# Patient Record
Sex: Female | Born: 1970 | Race: White | Hispanic: No | Smoking: Never smoker
Health system: Southern US, Community
[De-identification: ages and names within clinical notes are randomized; demographics above are authoritative.]

## PROBLEM LIST (undated history)

## (undated) DIAGNOSIS — F429 Obsessive-compulsive disorder, unspecified: Secondary | ICD-10-CM

## (undated) DIAGNOSIS — F419 Anxiety disorder, unspecified: Secondary | ICD-10-CM

## (undated) DIAGNOSIS — C189 Malignant neoplasm of colon, unspecified: Secondary | ICD-10-CM

## (undated) DIAGNOSIS — T7840XA Allergy, unspecified, initial encounter: Secondary | ICD-10-CM

## (undated) DIAGNOSIS — R51 Headache: Secondary | ICD-10-CM

## (undated) DIAGNOSIS — Z87442 Personal history of urinary calculi: Secondary | ICD-10-CM

## (undated) DIAGNOSIS — F99 Mental disorder, not otherwise specified: Secondary | ICD-10-CM

## (undated) DIAGNOSIS — N649 Disorder of breast, unspecified: Secondary | ICD-10-CM

## (undated) HISTORY — DX: Allergy, unspecified, initial encounter: T78.40XA

## (undated) HISTORY — DX: Headache: R51

## (undated) HISTORY — PX: BREAST EXCISIONAL BIOPSY: SUR124

## (undated) HISTORY — DX: Anxiety disorder, unspecified: F41.9

## (undated) HISTORY — DX: Obsessive-compulsive disorder, unspecified: F42.9

## (undated) HISTORY — PX: ENDOMETRIAL ABLATION: SHX621

## (undated) HISTORY — DX: Malignant neoplasm of colon, unspecified: C18.9

## (undated) HISTORY — DX: Disorder of breast, unspecified: N64.9

## (undated) HISTORY — DX: Mental disorder, not otherwise specified: F99

---
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
## 1997-05-31 HISTORY — PX: OTHER SURGICAL HISTORY: SHX169

## 2011-01-11 ENCOUNTER — Encounter: Payer: Self-pay | Admitting: Family Medicine

## 2011-01-11 ENCOUNTER — Encounter: Payer: Self-pay | Admitting: *Deleted

## 2011-01-11 ENCOUNTER — Ambulatory Visit (INDEPENDENT_AMBULATORY_CARE_PROVIDER_SITE_OTHER): Payer: Self-pay | Admitting: Family Medicine

## 2011-01-11 VITALS — BP 98/68 | HR 91 | Temp 97.9°F | Resp 18 | Ht 65.0 in | Wt 151.0 lb

## 2011-01-11 DIAGNOSIS — M9902 Segmental and somatic dysfunction of thoracic region: Secondary | ICD-10-CM

## 2011-01-11 DIAGNOSIS — M999 Biomechanical lesion, unspecified: Secondary | ICD-10-CM

## 2011-01-11 MED ORDER — CYCLOBENZAPRINE HCL 10 MG PO TABS: 10.0000 mg | ORAL_TABLET | Freq: Three times a day (TID) | ORAL | Status: AC | PRN

## 2011-01-11 NOTE — Progress Notes (Signed)
  Subjective:    Patient ID: Shirley Murray, female    DOB: February 14, 1971, 40 y.o.   MRN: [id number]  HPI Pt presents with pain in her Left axilla. She states her pain started Friday night while watching TV. She denies any activities or injuries that could have caused the pain. She has never had this pain before. The pain was severe enough that she almost went to the ER. Saturday, the pain was a little better, but today at work, the pain was significant and caused some discomfort with deep breathing.   Review of Systems Neg except as in HPI    Objective:   Physical Exam  Vitals reviewed.  Vitals reviewed. Point tenderness of the midaxilliary region of 4th Rib. No cervical or first rib dysfunction palpated. No Caliper motion of the rib with inspiration. No tenderness at rib heads anteriorly or posteriorly. Minimal assymmetry noted along the articulation of the rib head with the spinous processes.       Assessment & Plan:  1. Somatic Dysfunction of Left 4th Rib: OMT modalities used: SCS, muscle energy, myofascial release, Still's technique. Pt decline LVLA or HVLA. Pain relief initially, but pain returned. Repeat manipulation unsuccessful. Will try NSAID (OTC motrin) and Flexeril. WIll reeval in AM or prn to determine if additional manipulation is necessary.

## 2011-01-26 ENCOUNTER — Encounter: Payer: Self-pay | Admitting: Nurse Practitioner

## 2011-01-26 ENCOUNTER — Ambulatory Visit (INDEPENDENT_AMBULATORY_CARE_PROVIDER_SITE_OTHER): Payer: Commercial Managed Care - PPO | Admitting: Nurse Practitioner

## 2011-01-26 DIAGNOSIS — G4700 Insomnia, unspecified: Secondary | ICD-10-CM

## 2011-01-26 DIAGNOSIS — G43009 Migraine without aura, not intractable, without status migrainosus: Secondary | ICD-10-CM | POA: Insufficient documentation

## 2011-01-26 DIAGNOSIS — F429 Obsessive-compulsive disorder, unspecified: Secondary | ICD-10-CM

## 2011-01-26 MED ORDER — VENLAFAXINE HCL ER 150 MG PO CP24: 150.0000 mg | ORAL_CAPSULE | Freq: Every day | ORAL | Status: DC

## 2011-01-26 MED ORDER — ESZOPICLONE 2 MG PO TABS: 2.0000 mg | ORAL_TABLET | Freq: Every day | ORAL | Status: DC

## 2011-01-26 MED ORDER — TOPIRAMATE 200 MG PO TABS: 200.0000 mg | ORAL_TABLET | Freq: Every day | ORAL | Status: DC

## 2011-01-26 MED ORDER — TRAMADOL HCL 50 MG PO TABS: 50.0000 mg | ORAL_TABLET | Freq: Four times a day (QID) | ORAL | Status: AC | PRN

## 2011-01-26 MED ORDER — VENLAFAXINE HCL ER 75 MG PO CP24: 75.0000 mg | ORAL_CAPSULE | Freq: Every day | ORAL | Status: DC

## 2011-01-26 NOTE — Progress Notes (Signed)
  Subjective:    Patient ID: Shirley Murray, female    DOB: 06/02/1970, 40 y.o.   MRN: [id number]  HPI C/O migraine without aura. She has had migraine since she was 40 years old. Strong family history. Headaches are bilat temples and occipital. Severe in one month 3-5, moderates 13, milds 5.....almost daily headache of some sort. Headaches have a rapid onset.Now taking Imitrex and Topamax 150mg  for prevention. In past has taken toradol, zomig and maxalt.. Sleep is an issue for her. Has difficulty falling asleeep, taking over one hour to fall asleep and restless throughout the night and wakes approximately 2 times. Had some unusual rxn to [place] including calling people on phone. Has OCD and takes 40mg  paxil. Has side effects such as dry mouth and constipation. Currently lives with husband and children. No unusual stressors at work/ home. Is a nurse at [employer].      Review of Systems  Constitutional: Negative.   Eyes: Negative.   Respiratory: Negative.   Cardiovascular: Negative.   Gastrointestinal: Negative.   Genitourinary: Negative.   Musculoskeletal: Negative.   Neurological: Positive for headaches.  Psychiatric/Behavioral:       OCD       Objective:   Physical Exam  Constitutional: She is oriented to person, place, and time. She appears well-developed and well-nourished.  HENT:  Head: Normocephalic and atraumatic.  Eyes: Pupils are equal, round, and reactive to light.  Neck: Normal range of motion.  Cardiovascular: Normal rate and regular rhythm.   Pulmonary/Chest: Effort normal.  Musculoskeletal: Normal range of motion.  Neurological: She is alert and oriented to person, place, and time.  Skin: Skin is warm and dry.  Psychiatric: She has a normal mood and affect. Her behavior is normal. Judgment and thought content normal.          Assessment & Plan:  A: Migraine without aura. After lengthy discussion we have decided to taper her off Paxil. This should help  weight gain, constipation and dry mouth. She will take 20mg  for 2 weeks then off. In its place she will take effexor 75mg  for 2 weeks then up to 150mg  daily in am. If her OCD gets worse we will adjust this plan. We will also increase Topamax to 200mg  QHS. She will trial Lunesta 2 mg for sleep. She will continue to use imitrex, phenergan and add vicoden for rescue. We will see her back in 6 weeks or prn.

## 2011-02-16 ENCOUNTER — Encounter: Payer: Self-pay | Admitting: Nurse Practitioner

## 2011-02-16 ENCOUNTER — Ambulatory Visit (INDEPENDENT_AMBULATORY_CARE_PROVIDER_SITE_OTHER): Payer: Commercial Managed Care - PPO | Admitting: Nurse Practitioner

## 2011-02-16 DIAGNOSIS — G43909 Migraine, unspecified, not intractable, without status migrainosus: Secondary | ICD-10-CM

## 2011-02-16 DIAGNOSIS — F429 Obsessive-compulsive disorder, unspecified: Secondary | ICD-10-CM

## 2011-02-16 DIAGNOSIS — G4700 Insomnia, unspecified: Secondary | ICD-10-CM

## 2011-02-16 MED ORDER — CITALOPRAM HYDROBROMIDE 40 MG PO TABS: 40.0000 mg | ORAL_TABLET | Freq: Every day | ORAL | Status: DC

## 2011-02-16 MED ORDER — ZALEPLON 10 MG PO CAPS: 10.0000 mg | ORAL_CAPSULE | Freq: Every day | ORAL | Status: DC

## 2011-02-16 MED ORDER — ALPRAZOLAM 0.5 MG PO TABS: 0.5000 mg | ORAL_TABLET | Freq: Three times a day (TID) | ORAL | Status: AC | PRN

## 2011-02-16 NOTE — Progress Notes (Signed)
Last visit we had agreed upon a taper of effexor and paxil. She did not do well with effexor and ended up tapering off both. Has been off both for about one week. She felt she had an emotional rxn to effexor and did not want to take paxil secondary to constipation. Did very well with lunesta but cost was prohibitive. Difficult to know how headaches are at this time.  PE: negative exam  A: migraine  Insomnia OCD  P: We agreed to start Celexa for OCD. She will use xanax until then to keep her calmer. We will trial Sonata for sleep as it is generic. We discussed that it is impossible to know her headache status until her OCD and insomnia are corrected. She will RTC in 4-6 weeks and we will reeval then.

## 2011-02-26 ENCOUNTER — Other Ambulatory Visit (HOSPITAL_COMMUNITY): Payer: Self-pay | Admitting: Obstetrics

## 2011-02-26 DIAGNOSIS — Z1231 Encounter for screening mammogram for malignant neoplasm of breast: Secondary | ICD-10-CM

## 2011-03-09 ENCOUNTER — Ambulatory Visit (HOSPITAL_COMMUNITY)
Admission: RE | Admit: 2011-03-09 | Discharge: 2011-03-09 | Disposition: A | Discharge status: Home / Self Care | Payer: Commercial Managed Care - PPO | Source: Ambulatory Visit | Attending: Obstetrics | Admitting: Obstetrics

## 2011-03-09 DIAGNOSIS — Z1231 Encounter for screening mammogram for malignant neoplasm of breast: Secondary | ICD-10-CM | POA: Insufficient documentation

## 2011-03-24 ENCOUNTER — Ambulatory Visit (INDEPENDENT_AMBULATORY_CARE_PROVIDER_SITE_OTHER): Payer: Commercial Managed Care - PPO | Admitting: Family Medicine

## 2011-03-24 DIAGNOSIS — R109 Unspecified abdominal pain: Secondary | ICD-10-CM

## 2011-03-24 LAB — CBC WITH DIFFERENTIAL/PLATELET
HCT: 39.3 % (ref 36.0–46.0)
MCHC: 35.4 g/dL (ref 30.0–36.0)
Monocytes Absolute: 0.5 10*3/uL (ref 0.1–1.0)
Neutro Abs: 5.2 10*3/uL (ref 1.7–7.7)
Platelets: 212 10*3/uL (ref 150–400)
RDW: 12.8 % (ref 11.5–15.5)

## 2011-03-24 LAB — POCT PREGNANCY, URINE: Preg Test, Ur: NEGATIVE

## 2011-03-24 LAB — POCT URINALYSIS DIP (DEVICE): pH: 6 (ref 5.0–8.0)

## 2011-03-24 NOTE — Progress Notes (Signed)
Subjective:    Patient ID: Shirley Murray, female    DOB: Mar 03, 1971, 40 y.o.   MRN: [id number]  HPI Patient states she started to have sharp pain around her umbilicus last night around 7 PM. She states she has had no appetite but has eaten small portions of food since prior to the onset of the pain. She denies any nausea or vomiting. She reports last BM 2 days ago, but that she only goes 1-2 times a week. She does not feel like she needs to have a stool. She reports that the pain is sharp and makes it uncomfortable to sit, stand, or extend her legs. She states she feels very ill. Denies fever, chills, or sick contacts.   Review of Systems     Objective:   Physical Exam  Constitutional: She is oriented to person, place, and time. She appears well-developed and well-nourished. She appears distressed.  HENT:  Head: Normocephalic.  Neck: Normal range of motion.  Cardiovascular: Normal rate, regular rhythm and normal heart sounds.   No murmur heard. Pulmonary/Chest: Effort normal and breath sounds normal. No respiratory distress.  Abdominal: Soft. Bowel sounds are normal. She exhibits no distension. There is tenderness. There is guarding. There is no rebound.       Periumbilical tenderness, RLQ tenderness, +McBurneys point. Neg heel tap test  Neurological: She is alert and oriented to person, place, and time.  Skin: Skin is warm and dry. No rash noted.  Psychiatric: She has a normal mood and affect.     Results for orders placed in visit on 03/24/11 (from the past 24 hour(s))  CBC WITH DIFFERENTIAL     Status: Normal   Collection Time   03/24/11 11:17 AM      Component Value Range   WBC 7.8  4.0 - 10.5 (K/uL)   RBC 4.34  3.87 - 5.11 (MIL/uL)   Hemoglobin 13.9  12.0 - 15.0 (g/dL)   HCT 46.9  62.9 - 52.8 (%)   MCV 90.6  78.0 - 100.0 (fL)   MCH 32.0  26.0 - 34.0 (pg)   MCHC 35.4  30.0 - 36.0 (g/dL)   RDW 41.3  24.4 - 01.0 (%)   Platelets 212  150 - 400 (K/uL)   Neutrophils  Relative 66  43 - 77 (%)   Neutro Abs 5.2  1.7 - 7.7 (K/uL)   Lymphocytes Relative 28  12 - 46 (%)   Lymphs Abs 2.2  0.7 - 4.0 (K/uL)   Monocytes Relative 6  3 - 12 (%)   Monocytes Absolute 0.5  0.1 - 1.0 (K/uL)   Smear Review Criteria for review not met     Narrative:    Performed at:  [facility]                [address] [place], [address]                [place], [place] [address]  POCT URINALYSIS DIP (DEVICE)     Status: Abnormal   Collection Time   03/24/11 11:24 AM      Component Value Range   Glucose, UA NEGATIVE  NEGATIVE (mg/dL)   Bilirubin Urine SMALL (*) NEGATIVE    Ketones, ur TRACE (*) NEGATIVE (mg/dL)   Specific Gravity, Urine >=1.030  1.005 - 1.030    Hgb urine dipstick NEGATIVE  NEGATIVE    pH 6.0  5.0 - 8.0    Protein, ur NEGATIVE  NEGATIVE (mg/dL)   Urobilinogen,  UA 1.0  0.0 - 1.0 (mg/dL)   Nitrite NEGATIVE  NEGATIVE    Leukocytes, UA TRACE (*) NEGATIVE   POCT PREGNANCY, URINE     Status: Normal   Collection Time   03/24/11 11:27 AM      Component Value Range   Preg Test, Ur NEGATIVE          Assessment & Plan:  Abdominal pain  1. Normal WBC - unlikely to have appendicitis with normal WBC and no fever. Will watch closely and if symptoms worsen, fever or vomiting develop, will recheck WBC and consider CT scan  2. Suspect possible constipation: discussed use of OTC stool softners/laxatives (colace and miralax) with patient  3. UA normal, not suggestive of UTI at this time.  4. Consider ovarian cyst as well as part of differential as patient may be ovulating at this time.  Discussed "when to present to ER" and patient voiced understanding and agreed.

## 2011-05-04 ENCOUNTER — Other Ambulatory Visit: Payer: Commercial Managed Care - PPO

## 2011-05-04 DIAGNOSIS — J029 Acute pharyngitis, unspecified: Secondary | ICD-10-CM

## 2011-06-15 ENCOUNTER — Encounter: Payer: Self-pay | Admitting: Nurse Practitioner

## 2011-06-15 ENCOUNTER — Ambulatory Visit (INDEPENDENT_AMBULATORY_CARE_PROVIDER_SITE_OTHER): Payer: Commercial Managed Care - PPO | Admitting: Nurse Practitioner

## 2011-06-15 DIAGNOSIS — F429 Obsessive-compulsive disorder, unspecified: Secondary | ICD-10-CM | POA: Insufficient documentation

## 2011-06-15 DIAGNOSIS — R109 Unspecified abdominal pain: Secondary | ICD-10-CM

## 2011-06-15 DIAGNOSIS — R05 Cough: Secondary | ICD-10-CM | POA: Insufficient documentation

## 2011-06-15 DIAGNOSIS — F419 Anxiety disorder, unspecified: Secondary | ICD-10-CM | POA: Insufficient documentation

## 2011-06-15 DIAGNOSIS — F411 Generalized anxiety disorder: Secondary | ICD-10-CM

## 2011-06-15 DIAGNOSIS — G43909 Migraine, unspecified, not intractable, without status migrainosus: Secondary | ICD-10-CM

## 2011-06-15 MED ORDER — ALPRAZOLAM 0.5 MG PO TABS: 0.5000 mg | ORAL_TABLET | Freq: Two times a day (BID) | ORAL | Status: DC | PRN

## 2011-06-15 MED ORDER — PROMETHAZINE HCL 25 MG PO TABS: 25.0000 mg | ORAL_TABLET | Freq: Four times a day (QID) | ORAL | Status: DC | PRN

## 2011-06-15 MED ORDER — IBUPROFEN 800 MG PO TABS: 800.0000 mg | ORAL_TABLET | Freq: Three times a day (TID) | ORAL | Status: AC | PRN

## 2011-06-15 MED ORDER — CITALOPRAM HYDROBROMIDE 20 MG PO TABS: 20.0000 mg | ORAL_TABLET | Freq: Three times a day (TID) | ORAL | Status: DC

## 2011-06-15 MED ORDER — BENZONATATE 100 MG PO CAPS: 100.0000 mg | ORAL_CAPSULE | Freq: Four times a day (QID) | ORAL | Status: DC | PRN

## 2011-06-15 MED ORDER — PREDNISONE 20 MG PO TABS: 20.0000 mg | ORAL_TABLET | Freq: Once | ORAL | Status: AC

## 2011-06-15 MED ORDER — SUMATRIPTAN SUCCINATE 100 MG PO TABS: 100.0000 mg | ORAL_TABLET | ORAL | Status: DC | PRN

## 2011-06-15 NOTE — Progress Notes (Signed)
Here for migraine follow up.  Patient does qualify for Lynch testing due to family history.

## 2011-06-15 NOTE — Patient Instructions (Signed)

## 2011-06-15 NOTE — Progress Notes (Signed)
S: Pt is here for follow up on migraine headaches. She was last seen 02/15/2010. At that time she was to RTC in 4-6 weeks. Since then she feels her headaches are about the same. Her OCD is not being well controlled on Celexa at 40mg  and she would like to go up. Her topamax is stable and the sonata seems to work 4/7 nights. She has gotten a verbal warning for missing too many days at work and needs FMLA filled out. Today she C/O migraine that has been ongoing since Thursday. Part of the problem is she ran out of her Imitrex, has an ongoing cough, and there is a lot of stress at work.   O: Alert, oriented. Non productive cough. Lungs clear  A: migraine OCD Anxiety Cough Insomnia  P: Lengthy discussion concerning headaches and meds. We will leave Topamax alone and go up on Celexa to 60mg  daily for OCD that is not well controlled. She will take prednisone 80mg  tonight with supper. She is encouraged to get Imitrex filled and take motrin and phenergan with dose. The prednisone will help her cough as well. She is unwilling to take a prednisone taper due to anxiety. We will fill out FMLA and she can pick that up later. She will RTC in 6 weeks or sooner as needed

## 2011-06-29 ENCOUNTER — Ambulatory Visit: Payer: Commercial Managed Care - PPO | Admitting: Nurse Practitioner

## 2011-07-19 ENCOUNTER — Other Ambulatory Visit: Payer: Self-pay | Admitting: Nurse Practitioner

## 2011-07-27 ENCOUNTER — Ambulatory Visit (INDEPENDENT_AMBULATORY_CARE_PROVIDER_SITE_OTHER): Payer: Commercial Managed Care - PPO | Admitting: Nurse Practitioner

## 2011-07-27 ENCOUNTER — Encounter: Payer: Self-pay | Admitting: Nurse Practitioner

## 2011-07-27 DIAGNOSIS — G43909 Migraine, unspecified, not intractable, without status migrainosus: Secondary | ICD-10-CM

## 2011-07-27 MED ORDER — SUMATRIPTAN SUCCINATE 6 MG/0.5ML ~~LOC~~ SOLN: 6.0000 mg | SUBCUTANEOUS | Status: DC | PRN

## 2011-07-27 NOTE — Progress Notes (Signed)
S: Revisit for migraine, OCD, and insomnia. Since our last visit she has gone up on Celexa to 60mg  daily and has done well with that dose. She is " not perfect" but she knows she will never be. Declines offer to see Psych and have medication evaluation. She had her FMLA filled out and has missed 2 days work with migraine in last month. When we discussed what actually happens, she admits she wakes up with migraine, nausea and vomiting. By the time she is able to take oral Imitrex it is to late. Insomnia is somewhat helped by [facility], but she still does not sleep well.  A: Migraine, OCD, insomnia  P: We discussed using Imitrex injection for her early morning bad migraines. She understands concept. She will continue other medications as needed and follow up 3 months or prn

## 2011-07-27 NOTE — Patient Instructions (Signed)

## 2011-09-29 ENCOUNTER — Other Ambulatory Visit: Payer: Self-pay | Admitting: Gynecology

## 2011-12-01 ENCOUNTER — Other Ambulatory Visit: Payer: Self-pay | Admitting: Nurse Practitioner

## 2011-12-07 ENCOUNTER — Telehealth: Payer: Self-pay | Admitting: *Deleted

## 2011-12-07 DIAGNOSIS — F429 Obsessive-compulsive disorder, unspecified: Secondary | ICD-10-CM

## 2011-12-07 MED ORDER — TOPIRAMATE 200 MG PO TABS: 200.0000 mg | ORAL_TABLET | Freq: Every day | ORAL | Status: DC

## 2011-12-07 NOTE — Telephone Encounter (Signed)
Patient needs refill of Topamax.

## 2012-01-25 ENCOUNTER — Other Ambulatory Visit (HOSPITAL_COMMUNITY): Payer: Self-pay | Admitting: Obstetrics

## 2012-01-25 DIAGNOSIS — Z1231 Encounter for screening mammogram for malignant neoplasm of breast: Secondary | ICD-10-CM

## 2012-02-24 ENCOUNTER — Other Ambulatory Visit: Payer: Self-pay | Admitting: Nurse Practitioner

## 2012-03-14 ENCOUNTER — Ambulatory Visit (HOSPITAL_COMMUNITY)
Admission: RE | Admit: 2012-03-14 | Discharge: 2012-03-14 | Disposition: A | Discharge status: Home / Self Care | Payer: Commercial Managed Care - PPO | Source: Ambulatory Visit | Attending: Obstetrics | Admitting: Obstetrics

## 2012-03-14 DIAGNOSIS — Z1231 Encounter for screening mammogram for malignant neoplasm of breast: Secondary | ICD-10-CM | POA: Insufficient documentation

## 2012-04-04 ENCOUNTER — Encounter: Payer: Self-pay | Admitting: Nurse Practitioner

## 2012-04-04 ENCOUNTER — Ambulatory Visit (INDEPENDENT_AMBULATORY_CARE_PROVIDER_SITE_OTHER): Payer: 59 | Admitting: Nurse Practitioner

## 2012-04-04 VITALS — BP 100/51 | HR 64 | Temp 97.1°F | Resp 16 | Ht 64.75 in | Wt 157.0 lb

## 2012-04-04 DIAGNOSIS — F429 Obsessive-compulsive disorder, unspecified: Secondary | ICD-10-CM

## 2012-04-04 DIAGNOSIS — G43909 Migraine, unspecified, not intractable, without status migrainosus: Secondary | ICD-10-CM

## 2012-04-04 MED ORDER — RIZATRIPTAN BENZOATE 10 MG PO TABS: 10.0000 mg | ORAL_TABLET | ORAL | Status: DC | PRN

## 2012-04-04 MED ORDER — TOPIRAMATE 200 MG PO TABS: 200.0000 mg | ORAL_TABLET | Freq: Every day | ORAL | Status: DC

## 2012-04-04 MED ORDER — CITALOPRAM HYDROBROMIDE 20 MG PO TABS: 20.0000 mg | ORAL_TABLET | Freq: Three times a day (TID) | ORAL | Status: DC

## 2012-04-04 MED ORDER — SUMATRIPTAN SUCCINATE 6 MG/0.5ML ~~LOC~~ SOLN: 6.0000 mg | SUBCUTANEOUS | Status: AC | PRN

## 2012-04-04 MED ORDER — ZALEPLON 10 MG PO CAPS: 10.0000 mg | ORAL_CAPSULE | Freq: Every day | ORAL | Status: DC

## 2012-04-04 MED ORDER — ONDANSETRON 4 MG PO TBDP: 4.0000 mg | ORAL_TABLET | Freq: Three times a day (TID) | ORAL | Status: DC | PRN

## 2012-04-04 MED ORDER — PROMETHAZINE HCL 25 MG PO TABS: 25.0000 mg | ORAL_TABLET | Freq: Four times a day (QID) | ORAL | Status: DC | PRN

## 2012-04-04 NOTE — Patient Instructions (Addendum)
Migraine Headache A migraine headache is an intense, throbbing pain on one or both sides of your head. A migraine can last for 30 minutes to several hours. CAUSES  The exact cause of a migraine headache is not always known. However, a migraine may be caused when nerves in the brain become irritated and release chemicals that cause inflammation. This causes pain. SYMPTOMS  Pain on one or both sides of your head.  Pulsating or throbbing pain.  Severe pain that prevents daily activities.  Pain that is aggravated by any physical activity.  Nausea, vomiting, or both.  Dizziness.  Pain with exposure to bright lights, loud noises, or activity.  General sensitivity to bright lights, loud noises, or smells. Before you get a migraine, you may get warning signs that a migraine is coming (aura). An aura may include:  Seeing flashing lights.  Seeing bright spots, halos, or zig-zag lines.  Having tunnel vision or blurred vision.  Having feelings of numbness or tingling.  Having trouble talking.  Having muscle weakness. MIGRAINE TRIGGERS  Alcohol.  Smoking.  Stress.  Menstruation.  Aged cheeses.  Foods or drinks that contain nitrates, glutamate, aspartame, or tyramine.  Lack of sleep.  Chocolate.  Caffeine.  Hunger.  Physical exertion.  Fatigue.  Medicines used to treat chest pain (nitroglycerine), birth control pills, estrogen, and some blood pressure medicines. DIAGNOSIS  A migraine headache is often diagnosed based on:  Symptoms.  Physical examination.  A CT scan or MRI of your head. TREATMENT Medicines may be given for pain and nausea. Medicines can also be given to help prevent recurrent migraines.  HOME CARE INSTRUCTIONS  Only take over-the-counter or prescription medicines for pain or discomfort as directed by your caregiver. The use of long-term narcotics is not recommended.  Lie down in a dark, quiet room when you have a migraine.  Keep a journal  to find out what may trigger your migraine headaches. For example, write down:  What you eat and drink.  How much sleep you get.  Any change to your diet or medicines.  Limit alcohol consumption.  Quit smoking if you smoke.  Get 7 to 9 hours of sleep, or as recommended by your caregiver.  Limit stress.  Keep lights dim if bright lights bother you and make your migraines worse. SEEK IMMEDIATE MEDICAL CARE IF:   Your migraine becomes severe.  You have a fever.  You have a stiff neck.  You have vision loss.  You have muscular weakness or loss of muscle control.  You start losing your balance or have trouble walking.  You feel faint or pass out.  You have severe symptoms that are different from your first symptoms. MAKE SURE YOU:   Understand these instructions.  Will watch your condition.  Will get help right away if you are not doing well or get worse. Document Released: 05/17/2005 Document Revised: 08/09/2011 Document Reviewed: 05/07/2011 Exit[employer] Patient Information 2013 [employer].  

## 2012-04-10 ENCOUNTER — Telehealth: Payer: Self-pay | Admitting: *Deleted

## 2012-04-10 DIAGNOSIS — T7840XA Allergy, unspecified, initial encounter: Secondary | ICD-10-CM

## 2012-04-10 DIAGNOSIS — G43909 Migraine, unspecified, not intractable, without status migrainosus: Secondary | ICD-10-CM

## 2012-04-10 MED ORDER — HYDROXYZINE HCL 10 MG PO TABS: 10.0000 mg | ORAL_TABLET | Freq: Three times a day (TID) | ORAL | Status: DC | PRN

## 2012-04-10 MED ORDER — SUMATRIPTAN SUCCINATE 100 MG PO TABS: 100.0000 mg | ORAL_TABLET | Freq: Once | ORAL | Status: DC | PRN

## 2012-04-10 NOTE — Telephone Encounter (Signed)
Pt called stating that she had taken Imitrex last night and broke out in a rash and hives.  Spoke with Jannifer Rodney ,NP who ordered Vistaril and Imitrex tablets.  This was escribed to [facility]

## 2012-04-26 ENCOUNTER — Other Ambulatory Visit: Payer: Self-pay | Admitting: Nurse Practitioner

## 2012-04-26 DIAGNOSIS — F429 Obsessive-compulsive disorder, unspecified: Secondary | ICD-10-CM

## 2012-04-26 DIAGNOSIS — G43909 Migraine, unspecified, not intractable, without status migrainosus: Secondary | ICD-10-CM

## 2012-04-26 MED ORDER — TOPIRAMATE 200 MG PO TABS: 200.0000 mg | ORAL_TABLET | Freq: Every day | ORAL | Status: DC

## 2012-08-03 ENCOUNTER — Telehealth: Payer: Self-pay | Admitting: *Deleted

## 2012-08-03 ENCOUNTER — Other Ambulatory Visit: Payer: Self-pay | Admitting: *Deleted

## 2012-08-03 DIAGNOSIS — F329 Major depressive disorder, single episode, unspecified: Secondary | ICD-10-CM

## 2012-08-03 DIAGNOSIS — G43909 Migraine, unspecified, not intractable, without status migrainosus: Secondary | ICD-10-CM

## 2012-08-03 MED ORDER — TOPIRAMATE 200 MG PO TABS: 200.0000 mg | ORAL_TABLET | Freq: Every day | ORAL | Status: DC

## 2012-08-03 MED ORDER — DESVENLAFAXINE SUCCINATE ER 100 MG PO TB24: 100.0000 mg | ORAL_TABLET | Freq: Every day | ORAL | Status: DC

## 2012-08-03 NOTE — Telephone Encounter (Signed)
Per TO of Midge Aver, NP send RX for Pristiq 100mg   #30 once daily.  This was sent to [facility] pharmacy.

## 2012-08-03 NOTE — Telephone Encounter (Signed)
Pt called requesting a RF on Topamax and per TO Midge Aver, NP Ok to RF.

## 2012-11-20 ENCOUNTER — Other Ambulatory Visit: Payer: Self-pay | Admitting: Nurse Practitioner

## 2012-11-22 ENCOUNTER — Other Ambulatory Visit: Payer: Self-pay | Admitting: *Deleted

## 2012-11-22 DIAGNOSIS — G43909 Migraine, unspecified, not intractable, without status migrainosus: Secondary | ICD-10-CM

## 2012-11-22 MED ORDER — ZALEPLON 10 MG PO CAPS: ORAL_CAPSULE | ORAL | Status: DC

## 2012-11-22 NOTE — Telephone Encounter (Signed)
RF authorization per Jannifer Rodney, NP for Sonata 10 mg to be sent to [facility] out pt pharmacy.

## 2013-02-07 NOTE — Progress Notes (Signed)
S: Pt has changed jobs and seems less stressed  O: Alert, oriented, NAD Cardiac: RRR Lungs: Clear Neuro: Negative  A: Migraine, OCD  P: Continue meds as ordered

## 2013-02-13 ENCOUNTER — Other Ambulatory Visit (HOSPITAL_COMMUNITY): Payer: Self-pay | Admitting: Obstetrics

## 2013-02-13 DIAGNOSIS — Z1231 Encounter for screening mammogram for malignant neoplasm of breast: Secondary | ICD-10-CM

## 2013-03-13 ENCOUNTER — Encounter: Payer: Commercial Managed Care - PPO | Admitting: Nurse Practitioner

## 2013-03-13 ENCOUNTER — Encounter: Payer: Self-pay | Admitting: Nurse Practitioner

## 2013-03-13 ENCOUNTER — Ambulatory Visit (INDEPENDENT_AMBULATORY_CARE_PROVIDER_SITE_OTHER): Payer: 59 | Admitting: Nurse Practitioner

## 2013-03-13 VITALS — BP 108/68 | Ht 65.75 in | Wt 158.0 lb

## 2013-03-13 DIAGNOSIS — G43909 Migraine, unspecified, not intractable, without status migrainosus: Secondary | ICD-10-CM

## 2013-03-13 DIAGNOSIS — R635 Abnormal weight gain: Secondary | ICD-10-CM

## 2013-03-13 DIAGNOSIS — Z800 Family history of malignant neoplasm of digestive organs: Secondary | ICD-10-CM

## 2013-03-13 DIAGNOSIS — F429 Obsessive-compulsive disorder, unspecified: Secondary | ICD-10-CM

## 2013-03-13 DIAGNOSIS — F329 Major depressive disorder, single episode, unspecified: Secondary | ICD-10-CM

## 2013-03-13 DIAGNOSIS — G4700 Insomnia, unspecified: Secondary | ICD-10-CM | POA: Insufficient documentation

## 2013-03-13 MED ORDER — SUMATRIPTAN SUCCINATE 100 MG PO TABS: 100.0000 mg | ORAL_TABLET | Freq: Once | ORAL | Status: DC | PRN

## 2013-03-13 MED ORDER — DESVENLAFAXINE SUCCINATE ER 100 MG PO TB24: 100.0000 mg | ORAL_TABLET | Freq: Every day | ORAL | Status: DC

## 2013-03-13 MED ORDER — TOPIRAMATE 200 MG PO TABS: 200.0000 mg | ORAL_TABLET | Freq: Two times a day (BID) | ORAL | Status: DC

## 2013-03-13 MED ORDER — ZALEPLON 10 MG PO CAPS: ORAL_CAPSULE | ORAL | Status: DC

## 2013-03-13 NOTE — Addendum Note (Signed)
Addended by: Barbara Cower on: 03/13/2013 04:06 PM   Modules accepted: Orders

## 2013-03-13 NOTE — Patient Instructions (Signed)
Migraine Headache A migraine headache is an intense, throbbing pain on one or both sides of your head. A migraine can last for 30 minutes to several hours. CAUSES  The exact cause of a migraine headache is not always known. However, a migraine may be caused when nerves in the brain become irritated and release chemicals that cause inflammation. This causes pain. SYMPTOMS  Pain on one or both sides of your head.  Pulsating or throbbing pain.  Severe pain that prevents daily activities.  Pain that is aggravated by any physical activity.  Nausea, vomiting, or both.  Dizziness.  Pain with exposure to bright lights, loud noises, or activity.  General sensitivity to bright lights, loud noises, or smells. Before you get a migraine, you may get warning signs that a migraine is coming (aura). An aura may include:  Seeing flashing lights.  Seeing bright spots, halos, or zig-zag lines.  Having tunnel vision or blurred vision.  Having feelings of numbness or tingling.  Having trouble talking.  Having muscle weakness. MIGRAINE TRIGGERS  Alcohol.  Smoking.  Stress.  Menstruation.  Aged cheeses.  Foods or drinks that contain nitrates, glutamate, aspartame, or tyramine.  Lack of sleep.  Chocolate.  Caffeine.  Hunger.  Physical exertion.  Fatigue.  Medicines used to treat chest pain (nitroglycerine), birth control pills, estrogen, and some blood pressure medicines. DIAGNOSIS  A migraine headache is often diagnosed based on:  Symptoms.  Physical examination.  A CT scan or MRI of your head. TREATMENT Medicines may be given for pain and nausea. Medicines can also be given to help prevent recurrent migraines.  HOME CARE INSTRUCTIONS  Only take over-the-counter or prescription medicines for pain or discomfort as directed by your caregiver. The use of long-term narcotics is not recommended.  Lie down in a dark, quiet room when you have a migraine.  Keep a journal  to find out what may trigger your migraine headaches. For example, write down:  What you eat and drink.  How much sleep you get.  Any change to your diet or medicines.  Limit alcohol consumption.  Quit smoking if you smoke.  Get 7 to 9 hours of sleep, or as recommended by your caregiver.  Limit stress.  Keep lights dim if bright lights bother you and make your migraines worse. SEEK IMMEDIATE MEDICAL CARE IF:   Your migraine becomes severe.  You have a fever.  You have a stiff neck.  You have vision loss.  You have muscular weakness or loss of muscle control.  You start losing your balance or have trouble walking.  You feel faint or pass out.  You have severe symptoms that are different from your first symptoms. MAKE SURE YOU:   Understand these instructions.  Will watch your condition.  Will get help right away if you are not doing well or get worse. Document Released: 05/17/2005 Document Revised: 08/09/2011 Document Reviewed: 05/07/2011 Exit[employer] Patient Information 2014 [employer].  

## 2013-03-13 NOTE — Progress Notes (Signed)
History:  Shirley Murray is a 42 y.o. [id number] who presents to [facility] [facility] today for her yearly visit. She stopped her Pristique about 2-3 months ago because she was gaining weight. Since she stopped she has not lost any weight. 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 She would also like to have her Topamax increased. She is interested in breast cancer blood testing and TSH.   The following portions of the patient's history were reviewed and updated as appropriate: allergies, current medications, past family history, past medical history, past social history, past surgical history and problem list.  Review of Systems:  Positive for migraine, OCD, anxiety, weight gain  Objective:  Physical Exam BP 108/68  Ht 5' 5.75" (1.67 m)  Wt 158 lb (71.668 kg)  BMI 25.7 kg/m2  LMP 02/12/2013 GENERAL: Well-developed, well-nourished female in no acute distress.  HEENT: Normocephalic, atraumatic.  NECK: Supple. Normal thyroid.  LUNGS: Normal rate. Clear to auscultation bilaterally.  HEART: Regular rate and rhythm with no adventitious sounds.  EXTREMITIES: No cyanosis, clubbing, or edema, 2+ distal pulses.   Labs and Imaging No results found.  Assessment & Plan:  Assessment:  Migraine Insomnia OCD Anxiety  Plans:  Refill Imitrex, Sonata Increase Topamax to 200 mg BID Restart Pristique  Prepare for school, start exercise program  Carolynn Serve, NP 03/13/2013 3:54 PM

## 2013-03-18 ENCOUNTER — Encounter (HOSPITAL_COMMUNITY): Payer: Self-pay | Admitting: Emergency Medicine

## 2013-03-18 ENCOUNTER — Emergency Department (HOSPITAL_COMMUNITY): Payer: 59

## 2013-03-18 ENCOUNTER — Emergency Department (HOSPITAL_COMMUNITY)
Admission: EM | Admit: 2013-03-18 | Discharge: 2013-03-18 | Disposition: A | Discharge status: Home / Self Care | Payer: 59 | Attending: Emergency Medicine | Admitting: Emergency Medicine

## 2013-03-18 DIAGNOSIS — R110 Nausea: Secondary | ICD-10-CM | POA: Insufficient documentation

## 2013-03-18 DIAGNOSIS — Z8742 Personal history of other diseases of the female genital tract: Secondary | ICD-10-CM | POA: Insufficient documentation

## 2013-03-18 DIAGNOSIS — Z792 Long term (current) use of antibiotics: Secondary | ICD-10-CM | POA: Insufficient documentation

## 2013-03-18 DIAGNOSIS — F411 Generalized anxiety disorder: Secondary | ICD-10-CM | POA: Insufficient documentation

## 2013-03-18 DIAGNOSIS — Z3202 Encounter for pregnancy test, result negative: Secondary | ICD-10-CM | POA: Insufficient documentation

## 2013-03-18 DIAGNOSIS — N390 Urinary tract infection, site not specified: Secondary | ICD-10-CM | POA: Insufficient documentation

## 2013-03-18 DIAGNOSIS — Z79899 Other long term (current) drug therapy: Secondary | ICD-10-CM | POA: Insufficient documentation

## 2013-03-18 LAB — CBC WITH DIFFERENTIAL/PLATELET
Basophils Absolute: 0 10*3/uL (ref 0.0–0.1)
Basophils Relative: 0 % (ref 0–1)
HCT: 44.3 % (ref 36.0–46.0)
Hemoglobin: 16 g/dL — ABNORMAL HIGH (ref 12.0–15.0)
Lymphocytes Relative: 27 % (ref 12–46)
MCH: 31.4 pg (ref 26.0–34.0)
MCHC: 36.1 g/dL — ABNORMAL HIGH (ref 30.0–36.0)
Monocytes Absolute: 0.5 10*3/uL (ref 0.1–1.0)
Monocytes Relative: 7 % (ref 3–12)
Neutro Abs: 4.3 10*3/uL (ref 1.7–7.7)
Neutrophils Relative %: 64 % (ref 43–77)
RBC: 5.09 MIL/uL (ref 3.87–5.11)
WBC: 6.8 10*3/uL (ref 4.0–10.5)

## 2013-03-18 LAB — URINALYSIS, ROUTINE W REFLEX MICROSCOPIC
Ketones, ur: NEGATIVE mg/dL
Protein, ur: NEGATIVE mg/dL
Urobilinogen, UA: 0.2 mg/dL (ref 0.0–1.0)

## 2013-03-18 LAB — COMPREHENSIVE METABOLIC PANEL
ALT: 13 U/L (ref 0–35)
AST: 11 U/L (ref 0–37)
Albumin: 3.7 g/dL (ref 3.5–5.2)
Alkaline Phosphatase: 75 U/L (ref 39–117)
BUN: 10 mg/dL (ref 6–23)
CO2: 18 mEq/L — ABNORMAL LOW (ref 19–32)
Chloride: 111 mEq/L (ref 96–112)
Creatinine, Ser: 0.57 mg/dL (ref 0.50–1.10)
GFR calc non Af Amer: >90 mL/min (ref >90)
Potassium: 3.5 mEq/L (ref 3.5–5.1)
Total Bilirubin: 0.6 mg/dL (ref 0.3–1.2)

## 2013-03-18 LAB — POCT PREGNANCY, URINE: Preg Test, Ur: NEGATIVE

## 2013-03-18 LAB — URINE MICROSCOPIC-ADD ON

## 2013-03-18 MED ORDER — ONDANSETRON HCL 4 MG/2ML IJ SOLN
4.0000 mg | Freq: Once | INTRAMUSCULAR | Status: DC
  Filled 2013-03-18: qty 2

## 2013-03-18 MED ORDER — CIPROFLOXACIN HCL 500 MG PO TABS: 500.0000 mg | ORAL_TABLET | Freq: Two times a day (BID) | ORAL | Status: DC

## 2013-03-18 MED ORDER — ONDANSETRON HCL 4 MG/2ML IJ SOLN
4.0000 mg | Freq: Once | INTRAMUSCULAR | Status: AC
  Administered 2013-03-18: 4 mg via INTRAVENOUS

## 2013-03-18 MED ORDER — SODIUM CHLORIDE 0.9 % IV SOLN
Freq: Once | INTRAVENOUS | Status: AC
  Administered 2013-03-18: 10:00:00 via INTRAVENOUS

## 2013-03-18 MED ORDER — CIPROFLOXACIN HCL 250 MG PO TABS
500.0000 mg | ORAL_TABLET | Freq: Once | ORAL | Status: AC
  Administered 2013-03-18: 500 mg via ORAL
  Filled 2013-03-18: qty 2

## 2013-03-18 MED ORDER — KETOROLAC TROMETHAMINE 30 MG/ML IJ SOLN
30.0000 mg | Freq: Once | INTRAMUSCULAR | Status: AC
  Administered 2013-03-18: 30 mg via INTRAVENOUS
  Filled 2013-03-18: qty 1

## 2013-03-18 MED ORDER — TRAMADOL HCL 50 MG PO TABS: 50.0000 mg | ORAL_TABLET | Freq: Four times a day (QID) | ORAL | Status: DC | PRN

## 2013-03-18 NOTE — ED Notes (Signed)
Pt reports sudden onset of severe LLQ pain this am and nausea.  Pt says pain has decreased but still rates at 5.  Denies diarrhea.  LBM was yesterday.  LMP approx 1 month ago.

## 2013-03-19 LAB — URINE CULTURE: Colony Count: 15000

## 2013-03-20 ENCOUNTER — Ambulatory Visit (HOSPITAL_COMMUNITY)
Admission: RE | Admit: 2013-03-20 | Discharge: 2013-03-20 | Disposition: A | Discharge status: Home / Self Care | Payer: 59 | Source: Ambulatory Visit | Attending: Obstetrics | Admitting: Obstetrics

## 2013-03-20 DIAGNOSIS — Z1231 Encounter for screening mammogram for malignant neoplasm of breast: Secondary | ICD-10-CM | POA: Insufficient documentation

## 2013-03-20 NOTE — ED Provider Notes (Signed)
CSN: [id number]     Arrival date & time 03/18/13  0911 History   First MD Initiated Contact with Patient 03/18/13 [phone]     Chief Complaint  Patient presents with  . Abdominal Pain   (Consider location/radiation/quality/duration/timing/severity/associated sxs/prior Treatment) Patient is a 42 y.o. female presenting with abdominal pain. The history is provided by the patient.  Abdominal Pain Pain location:  LLQ Pain quality: aching, cramping and shooting   Pain radiates to:  Does not radiate Pain severity:  Moderate Onset quality:  Sudden Duration:  2 hours Timing:  Constant Progression:  Partially resolved Chronicity:  New Context: awakening from sleep   Context: not alcohol use, not eating, not previous surgeries, not recent illness, not recent travel, not retching, not suspicious food intake and not trauma   Relieved by:  Nothing Worsened by:  Nothing tried Ineffective treatments:  None tried Associated symptoms: nausea   Associated symptoms: no chest pain, no chills, no constipation, no cough, no diarrhea, no dysuria, no fever, no hematemesis, no hematochezia, no hematuria, no melena, no shortness of breath, no sore throat, no vaginal bleeding, no vaginal discharge and no vomiting   Associated symptoms comment:  Pain improved after urination and rest Risk factors: not pregnant and no recent hospitalization     Past Medical History  Diagnosis Date  . Breast disorder     fibrocystic breast   . Mental disorder     ocd  . OCD (obsessive compulsive disorder)   . Allergy     codeine  . Anxiety   . [id number](956.2)    Past Surgical History  Procedure Laterality Date  . Cyst removed from right breast  1999  . Birth mark removed from left arm  1974   Family History  Problem Relation Age of Onset  . Cancer Paternal Grandfather     bone  . Hypertension Paternal Grandmother   . Cancer Paternal Grandmother     colon/deceased  . Stroke Maternal Grandmother   . Diabetes  Maternal Grandmother     adult onset  . Parkinsonism Maternal Grandmother   . Cancer Maternal Grandfather     oral  . Hypertension Maternal Grandfather   . Diabetes Maternal Grandfather     adult onset  . Hypertension Father   . Mental illness Father     bipolar  . Cancer Mother 53    uterine cancer  . Colon cancer Paternal Aunt 100  . Cancer Paternal Aunt     colon cancer  . Cancer Paternal Uncle     colon cancer   History  Substance Use Topics  . Smoking status: Never Smoker   . Smokeless tobacco: Never Used  . Alcohol Use: Yes     Comment: rare   OB History   Grav Para Term Preterm Abortions TAB SAB Ect Mult Living   3 3 3  0 0 0 0 0 0 3     Review of Systems  Constitutional: Negative for fever, chills and appetite change.  HENT: Negative for sore throat.   Respiratory: Negative for cough, chest tightness and shortness of breath.   Cardiovascular: Negative for chest pain.  Gastrointestinal: Positive for nausea and abdominal pain. Negative for vomiting, diarrhea, constipation, blood in stool, melena, hematochezia and hematemesis.  Genitourinary: Positive for flank pain. Negative for dysuria, frequency, hematuria, decreased urine volume, vaginal bleeding, vaginal discharge, difficulty urinating, vaginal pain and pelvic pain.  Musculoskeletal: Negative for back pain.  Skin: Negative for color change and rash.  Neurological:  Negative for dizziness, weakness and numbness.  Hematological: Negative for adenopathy.  All other systems reviewed and are negative.    Allergies  Codeine and Maxalt  Home Medications   Current Outpatient Rx  Name  Route  Sig  Dispense  Refill  . ciprofloxacin (CIPRO) 500 MG tablet   Oral   Take 1 tablet (500 mg total) by mouth 2 (two) times daily. For 7 days   14 tablet   0   . desvenlafaxine (PRISTIQ) 100 MG 24 hr tablet   Oral   Take 1 tablet (100 mg total) by mouth daily.   30 tablet   11   . ondansetron (ZOFRAN-ODT) 4 MG  disintegrating tablet   Oral   Take 1 tablet (4 mg total) by mouth every 8 (eight) hours as needed.   20 tablet   3   . promethazine (PHENERGAN) 25 MG tablet   Oral   Take 1 tablet (25 mg total) by mouth every 6 (six) hours as needed.   30 tablet   2   . SUMAtriptan (IMITREX) 100 MG tablet   Oral   Take 1 tablet (100 mg total) by mouth once as needed for migraine.   9 tablet   11   . SUMAtriptan (IMITREX) 6 MG/0.5ML SOLN injection   Subcutaneous   Inject 0.5 mLs (6 mg total) into the skin every 2 (two) hours as needed for migraine or headache. F   4 vial   6   . topiramate (TOPAMAX) 200 MG tablet   Oral   Take 1 tablet (200 mg total) by mouth 2 (two) times daily.   60 tablet   11   . traMADol (ULTRAM) 50 MG tablet   Oral   Take 1 tablet (50 mg total) by mouth every 6 (six) hours as needed for pain.   15 tablet   0   . zaleplon (SONATA) 10 MG capsule      TAKE 1 CAPSULE BY MOUTH AT BEDTIME   30 capsule   5    BP 99/53  Pulse 77  Temp(Src) 97 F (36.1 C) (Oral)  Resp 18  Ht 5\' 5"  (1.651 m)  Wt 148 lb (67.132 kg)  BMI 24.63 kg/m2  SpO2 96%  LMP 02/12/2013 Physical Exam  Nursing note and vitals reviewed. Constitutional: She is oriented to person, place, and time. She appears well-developed and well-nourished. No distress.  HENT:  Head: Normocephalic and atraumatic.  Mouth/Throat: Oropharynx is clear and moist.  Cardiovascular: Normal rate, regular rhythm, normal heart sounds and intact distal pulses.   No murmur heard. Pulmonary/Chest: Effort normal and breath sounds normal. No respiratory distress. She exhibits no tenderness.  Abdominal: Soft. Normal appearance and bowel sounds are normal. She exhibits no distension and no mass. There is no hepatosplenomegaly. There is tenderness in the left lower quadrant. There is CVA tenderness. There is no rigidity, no rebound, no guarding and no tenderness at McBurney's point.    Musculoskeletal: Normal range of  motion. She exhibits no edema.  Neurological: She is alert and oriented to person, place, and time. She exhibits normal muscle tone. Coordination normal.  Skin: Skin is warm and dry.    ED Course  Procedures (including critical care time) Labs Review Results for orders placed during the hospital encounter of 03/18/13  URINE CULTURE      Result Value Range   Specimen Description URINE, CLEAN CATCH     Special Requests NONE     Culture  Setup  Time       Value: 03/18/2013 22:05     Performed at [facility] Count       Value: 15,000 COLONIES/ML     Performed at [facility]   Culture       Value: Multiple bacterial morphotypes present, none predominant. Suggest appropriate recollection if clinically indicated.     Performed at [facility]   Report Status 03/19/2013 FINAL    URINALYSIS, ROUTINE W REFLEX MICROSCOPIC      Result Value Range   Color, Urine YELLOW  YELLOW   APPearance HAZY (*) CLEAR   Specific Gravity, Urine 1.020  1.005 - 1.030   pH 6.0  5.0 - 8.0   Glucose, UA NEGATIVE  NEGATIVE mg/dL   Hgb urine dipstick MODERATE (*) NEGATIVE   Bilirubin Urine NEGATIVE  NEGATIVE   Ketones, ur NEGATIVE  NEGATIVE mg/dL   Protein, ur NEGATIVE  NEGATIVE mg/dL   Urobilinogen, UA 0.2  0.0 - 1.0 mg/dL   Nitrite NEGATIVE  NEGATIVE   Leukocytes, UA SMALL (*) NEGATIVE  CBC WITH DIFFERENTIAL      Result Value Range   WBC 6.8  4.0 - 10.5 K/uL   RBC 5.09  3.87 - 5.11 MIL/uL   Hemoglobin 16.0 (*) 12.0 - 15.0 g/dL   HCT 16.1  09.6 - 04.5 %   MCV 87.0  78.0 - 100.0 fL   MCH 31.4  26.0 - 34.0 pg   MCHC 36.1 (*) 30.0 - 36.0 g/dL   RDW 40.9  81.1 - 91.4 %   Platelets 239  150 - 400 K/uL   Neutrophils Relative % 64  43 - 77 %   Neutro Abs 4.3  1.7 - 7.7 K/uL   Lymphocytes Relative 27  12 - 46 %   Lymphs Abs 1.8  0.7 - 4.0 K/uL   Monocytes Relative 7  3 - 12 %   Monocytes Absolute 0.5  0.1 - 1.0 K/uL   Eosinophils Relative 2  0 - 5 %   Eosinophils  Absolute 0.2  0.0 - 0.7 K/uL   Basophils Relative 0  0 - 1 %   Basophils Absolute 0.0  0.0 - 0.1 K/uL  COMPREHENSIVE METABOLIC PANEL      Result Value Range   Sodium 138  135 - 145 mEq/L   Potassium 3.5  3.5 - 5.1 mEq/L   Chloride 111  96 - 112 mEq/L   CO2 18 (*) 19 - 32 mEq/L   Glucose, Bld 97  70 - 99 mg/dL   BUN 10  6 - 23 mg/dL   Creatinine, Ser 7.82  0.50 - 1.10 mg/dL   Calcium 8.7  8.4 - 95.6 mg/dL   Total Protein 6.7  6.0 - 8.3 g/dL   Albumin 3.7  3.5 - 5.2 g/dL   AST 11  0 - 37 U/L   ALT 13  0 - 35 U/L   Alkaline Phosphatase 75  39 - 117 U/L   Total Bilirubin 0.6  0.3 - 1.2 mg/dL   GFR calc non Af Amer >90  >90 mL/min   GFR calc Af Amer >90  >90 mL/min  URINE MICROSCOPIC-ADD ON      Result Value Range   Squamous Epithelial / LPF FEW (*) RARE   WBC, UA 7-10  <3 WBC/hpf   RBC / HPF 3-6  <3 RBC/hpf   Bacteria, UA MANY (*) RARE   Urine-Other MUCOUS PRESENT    POCT  PREGNANCY, URINE      Result Value Range   Preg Test, Ur NEGATIVE  NEGATIVE    Imaging Review Ct Abdomen Pelvis Wo Contrast  03/18/2013   CLINICAL DATA:  Left-sided pain  EXAM: CT ABDOMEN AND PELVIS WITHOUT  TECHNIQUE: Multidetector CT imaging of the chest, abdomen and pelvis was performed following the standard protocol without IV contrast.  COMPARISON:  None.  FINDINGS: No urinary calculus. No hydronephrosis.  5 mm hypodensity in the lateral segment of the left lobe of the liver on image 18.  The gallbladder, spleen, pancreas, right adrenal gland are within normal limits. Benign adenoma in the left adrenal gland.  Normal appendix.  No evidence of sigmoid diverticulitis  Bladder, uterus, and adnexa are within normal limits.  Unremarkable lumbar spine.  No free-fluid. No obvious retroperitoneal adenopathy.  IMPRESSION: No evidence of urinary calculus or urinary obstruction.  Benign left adrenal adenoma.  Small nonspecific and the density in the left lobe of the liver. This is statistically benign. If the patient has  high risk of malignancy or a history of malignancy, consider MRI for further evaluation.   Electronically Signed   By: Maryclare Bean M.D.   On: 03/18/2013 11:29    EKG Interpretation   None       MDM   1. Urinary tract infection     Sudden onset of LLQ pain that partially resolved prior to ED arrival.  U/A suggests likely UTI .  Sx's to LLQ and flank suggest possible early pyelonephritis.  No CT evidence of kidney stone, no pelvic pain, vaginal discharge or hx of abnml vaginal bleeding.  Pain improved after IVF's and pain medication.  Will prescribe cipro , urine culture pending, and pt agrees to close f/u with her PMD or to return here if her sx's worsen.  She is feeling better, requesting discharge and appears stable for discharge.   Montell Leopard L. Trisha Mangle, PA-C 03/20/13 1947

## 2013-03-27 NOTE — ED Provider Notes (Signed)
Medical screening examination/treatment/procedure(s) were performed by non-physician practitioner and as supervising physician I was immediately available for consultation/collaboration.  EKG Interpretation   None        Ashya Nicolaisen, MD 03/27/13 1728 

## 2013-04-05 ENCOUNTER — Encounter: Payer: Self-pay | Admitting: *Deleted

## 2013-04-19 ENCOUNTER — Other Ambulatory Visit: Payer: Self-pay | Admitting: Nurse Practitioner

## 2013-04-24 ENCOUNTER — Other Ambulatory Visit: Payer: Self-pay | Admitting: *Deleted

## 2013-04-24 DIAGNOSIS — G43909 Migraine, unspecified, not intractable, without status migrainosus: Secondary | ICD-10-CM

## 2013-04-24 MED ORDER — ONDANSETRON 4 MG PO TBDP: 4.0000 mg | ORAL_TABLET | Freq: Three times a day (TID) | ORAL | Status: DC | PRN

## 2013-04-24 NOTE — Telephone Encounter (Signed)
Rf authorization given for Zofran ODT sent to [facility] pharmacy per Jannifer Rodney, NP

## 2013-09-18 ENCOUNTER — Encounter: Payer: Self-pay | Admitting: Nurse Practitioner

## 2013-09-18 ENCOUNTER — Ambulatory Visit (INDEPENDENT_AMBULATORY_CARE_PROVIDER_SITE_OTHER): Payer: 59 | Admitting: Nurse Practitioner

## 2013-09-18 VITALS — BP 92/61 | HR 65 | Ht 65.0 in | Wt 159.0 lb

## 2013-09-18 DIAGNOSIS — F411 Generalized anxiety disorder: Secondary | ICD-10-CM

## 2013-09-18 DIAGNOSIS — F419 Anxiety disorder, unspecified: Secondary | ICD-10-CM

## 2013-09-18 DIAGNOSIS — G43009 Migraine without aura, not intractable, without status migrainosus: Secondary | ICD-10-CM

## 2013-09-18 MED ORDER — ELETRIPTAN HYDROBROMIDE 40 MG PO TABS: 40.0000 mg | ORAL_TABLET | ORAL | Status: DC | PRN

## 2013-09-18 NOTE — Progress Notes (Signed)
History:  Shirley Murray is a 43 y.o. [phone] who presents to [facility] clinic today for follow up on migraines. She is doing worse right now due to co-worker who has an odor that is triggering her migraines. She has spoken to HR who have asked her to get a note from doctors office. She is taking preventative Topamax 400 mg. She has not noticed much difference with pristique and has not decided to restart it. She is back in school and that is a challenge. She had a rash to Imitrex and would like to change triptans. If she has a rash with Replax she will discontinue all triptans. She needs her FMLA filled out and mailed back to her. She has changed her Zyrtec to Allegra  The following portions of the patient's history were reviewed and updated as appropriate: allergies, current medications, past family history, past medical history, past social history, past surgical history and problem list.  Review of Systems:  Pertinent items are noted in HPI.  Objective:  Physical Exam BP 92/61  Pulse 65  Ht 5\' 5"  (1.651 m)  Wt 159 lb (72.122 kg)  BMI 26.46 kg/m2  LMP 09/18/2013 GENERAL: Well-developed, well-nourished female in no acute distress.  HEENT: Normocephalic, atraumatic.  NECK: Supple. Normal thyroid.  LUNGS: Normal rate. Clear to auscultation bilaterally.  HEART: Regular rate and rhythm with no adventitious sounds.  EXTREMITIES: No cyanosis, clubbing, or edema, 2+ distal pulses.   Labs and Imaging No results found.  Assessment & Plan:  Assessment:  Migraines OCD   Plans:  Given note to indicate odor of coworker is triggering migraines Change Imitrex to Relpax FMLA papers to be filled out Consider resuming Pristique Follow up 6 months to 1 year  Olegario Messier, NP 09/18/2013 3:54 PM

## 2013-09-18 NOTE — Patient Instructions (Signed)
Migraine Headache A migraine headache is an intense, throbbing pain on one or both sides of your head. A migraine can last for 30 minutes to several hours. CAUSES  The exact cause of a migraine headache is not always known. However, a migraine may be caused when nerves in the brain become irritated and release chemicals that cause inflammation. This causes pain. Certain things may also trigger migraines, such as:  Alcohol.  Smoking.  Stress.  Menstruation.  Aged cheeses.  Foods or drinks that contain nitrates, glutamate, aspartame, or tyramine.  Lack of sleep.  Chocolate.  Caffeine.  Hunger.  Physical exertion.  Fatigue.  Medicines used to treat chest pain (nitroglycerine), birth control pills, estrogen, and some blood pressure medicines. SIGNS AND SYMPTOMS  Pain on one or both sides of your head.  Pulsating or throbbing pain.  Severe pain that prevents daily activities.  Pain that is aggravated by any physical activity.  Nausea, vomiting, or both.  Dizziness.  Pain with exposure to bright lights, loud noises, or activity.  General sensitivity to bright lights, loud noises, or smells. Before you get a migraine, you may get warning signs that a migraine is coming (aura). An aura may include:  Seeing flashing lights.  Seeing bright spots, halos, or zig-zag lines.  Having tunnel vision or blurred vision.  Having feelings of numbness or tingling.  Having trouble talking.  Having muscle weakness. DIAGNOSIS  A migraine headache is often diagnosed based on:  Symptoms.  Physical exam.  A CT scan or MRI of your head. These imaging tests cannot diagnose migraines, but they can help rule out other causes of headaches. TREATMENT Medicines may be given for pain and nausea. Medicines can also be given to help prevent recurrent migraines.  HOME CARE INSTRUCTIONS  Only take over-the-counter or prescription medicines for pain or discomfort as directed by your  health care provider. The use of long-term narcotics is not recommended.  Lie down in a dark, quiet room when you have a migraine.  Keep a journal to find out what may trigger your migraine headaches. For example, write down:  What you eat and drink.  How much sleep you get.  Any change to your diet or medicines.  Limit alcohol consumption.  Quit smoking if you smoke.  Get 7 9 hours of sleep, or as recommended by your health care provider.  Limit stress.  Keep lights dim if bright lights bother you and make your migraines worse. SEEK IMMEDIATE MEDICAL CARE IF:   Your migraine becomes severe.  You have a fever.  You have a stiff neck.  You have vision loss.  You have muscular weakness or loss of muscle control.  You start losing your balance or have trouble walking.  You feel faint or pass out.  You have severe symptoms that are different from your first symptoms. MAKE SURE YOU:   Understand these instructions.  Will watch your condition.  Will get help right away if you are not doing well or get worse. Document Released: 05/17/2005 Document Revised: 03/07/2013 Document Reviewed: 01/22/2013 Exit[employer] Patient Information 2014 [employer].  

## 2013-09-25 ENCOUNTER — Other Ambulatory Visit: Payer: Self-pay | Admitting: *Deleted

## 2013-09-25 DIAGNOSIS — R51 Headache: Principal | ICD-10-CM

## 2013-09-25 DIAGNOSIS — R519 Headache, unspecified: Secondary | ICD-10-CM

## 2013-09-25 MED ORDER — TRAMADOL HCL 50 MG PO TABS: 50.0000 mg | ORAL_TABLET | Freq: Four times a day (QID) | ORAL | Status: DC | PRN

## 2013-09-25 NOTE — Telephone Encounter (Signed)
Pt called and needed something else called in for her headaches.  She said the Relpax that was prescribed last week broke her out into a rash.  I told pt to discontinue the Relpax and we would call in Ultram for her to take as needed.  Okay per [employer].

## 2013-11-06 ENCOUNTER — Telehealth: Payer: Self-pay | Admitting: *Deleted

## 2013-11-06 DIAGNOSIS — G43909 Migraine, unspecified, not intractable, without status migrainosus: Secondary | ICD-10-CM

## 2013-11-06 MED ORDER — DICLOFENAC SODIUM 75 MG PO TBEC: 75.0000 mg | DELAYED_RELEASE_TABLET | Freq: Two times a day (BID) | ORAL | Status: DC

## 2013-11-06 NOTE — Telephone Encounter (Signed)
Spoke to patient regarding headaches.  Per Shirley Murray ok to call in [place], patient will try this while we send her information to Botox to see if this is covered for her.

## 2013-11-06 NOTE — Telephone Encounter (Signed)
Patient is calling because the medications we have given her she is unable to take due to breaking out in a rash and the Ultram does not work at all for her.  She did start back the pristiq and is taking the topamax at 400mg  a day.  She does not wish to use a nasal spray as she has trouble doing those and wants to know if you have any other suggestions for her?  Just let me know and I will give her a call back.

## 2014-03-04 ENCOUNTER — Telehealth: Payer: Self-pay | Admitting: *Deleted

## 2014-03-04 NOTE — Telephone Encounter (Signed)
Ok to refill xanax per Vaughan Basta.  Called into pharmacy.
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
## 2014-04-01 ENCOUNTER — Encounter: Payer: Self-pay | Admitting: Nurse Practitioner

## 2014-04-10 ENCOUNTER — Telehealth: Payer: Self-pay

## 2014-04-10 ENCOUNTER — Other Ambulatory Visit: Payer: Self-pay | Admitting: Nurse Practitioner

## 2014-04-10 NOTE — Telephone Encounter (Signed)
Patient needed refills on her topamax and Pristiq gave her refills until her next visit in April. Doing well just needed refills on those two meds. Not due for yearly until April!

## 2014-06-12 ENCOUNTER — Encounter: Payer: Self-pay | Admitting: Nurse Practitioner

## 2014-06-24 ENCOUNTER — Telehealth: Payer: Self-pay | Admitting: *Deleted

## 2014-06-24 ENCOUNTER — Encounter: Payer: Self-pay | Admitting: Nurse Practitioner

## 2014-06-24 DIAGNOSIS — G43801 Other migraine, not intractable, with status migrainosus: Secondary | ICD-10-CM

## 2014-06-24 MED ORDER — PREDNISONE 20 MG PO TABS: ORAL_TABLET | ORAL | Status: DC

## 2014-06-24 NOTE — Telephone Encounter (Signed)
Pt called stating that she has had a headache for 1 week and can't get rid of it.  Spoke with Monna Fam, NP who ordered a Prednisone tapered dose for 4 days.  This was sent to [facility].

## 2014-07-02 ENCOUNTER — Encounter: Payer: Self-pay | Admitting: Nurse Practitioner

## 2014-07-02 ENCOUNTER — Ambulatory Visit (INDEPENDENT_AMBULATORY_CARE_PROVIDER_SITE_OTHER): Payer: 59 | Admitting: Nurse Practitioner

## 2014-07-02 VITALS — BP 107/70 | HR 92 | Resp 16 | Ht 65.0 in | Wt 153.0 lb

## 2014-07-02 DIAGNOSIS — F429 Obsessive-compulsive disorder, unspecified: Secondary | ICD-10-CM

## 2014-07-02 DIAGNOSIS — F329 Major depressive disorder, single episode, unspecified: Secondary | ICD-10-CM

## 2014-07-02 DIAGNOSIS — G43109 Migraine with aura, not intractable, without status migrainosus: Secondary | ICD-10-CM | POA: Insufficient documentation

## 2014-07-02 DIAGNOSIS — G4700 Insomnia, unspecified: Secondary | ICD-10-CM

## 2014-07-02 DIAGNOSIS — F32A Depression, unspecified: Secondary | ICD-10-CM

## 2014-07-02 DIAGNOSIS — F419 Anxiety disorder, unspecified: Secondary | ICD-10-CM

## 2014-07-02 DIAGNOSIS — F42 Obsessive-compulsive disorder: Secondary | ICD-10-CM

## 2014-07-02 DIAGNOSIS — G43509 Persistent migraine aura without cerebral infarction, not intractable, without status migrainosus: Secondary | ICD-10-CM

## 2014-07-02 MED ORDER — KETOROLAC TROMETHAMINE 60 MG/2ML IM SOLN
60.0000 mg | Freq: Once | INTRAMUSCULAR | Status: AC
  Administered 2014-07-02: 60 mg via INTRAMUSCULAR

## 2014-07-02 MED ORDER — ISOMETHEPTENE-APAP-DICHLORAL 65-325-100 MG PO CAPS: 1.0000 | ORAL_CAPSULE | Freq: Four times a day (QID) | ORAL | Status: DC | PRN

## 2014-07-02 MED ORDER — PROMETHAZINE HCL 25 MG PO TABS: 25.0000 mg | ORAL_TABLET | Freq: Four times a day (QID) | ORAL | Status: DC | PRN

## 2014-07-02 MED ORDER — DESVENLAFAXINE SUCCINATE ER 100 MG PO TB24: 100.0000 mg | ORAL_TABLET | Freq: Every day | ORAL | Status: DC

## 2014-07-02 MED ORDER — TOPIRAMATE 200 MG PO TABS: 200.0000 mg | ORAL_TABLET | Freq: Three times a day (TID) | ORAL | Status: DC

## 2014-07-02 MED ORDER — ONDANSETRON HCL 4 MG PO TABS: 4.0000 mg | ORAL_TABLET | Freq: Three times a day (TID) | ORAL | Status: DC | PRN

## 2014-07-02 NOTE — Patient Instructions (Signed)

## 2014-07-02 NOTE — Progress Notes (Signed)
History:  Shirley Murray is a 44 y.o. [phone] who presents to [facility] clinic today for follow up with migraines. She has been doing worse since June. She is currently having 12 severe, 16 moderate per month and they are lasting for longer than 4 hours. She has a great deal of issues with medications and has rash to most triptans. She was recently given steroid taper which did not help and made her anxiety and insomnia worse. She has noticed lately that she has developed an aura in left eye that is squiggly line. This is new for her and she has never had an MRI and declines one today. We are awaiting approval of Botox for prevention. She is willing to go up on Topamax. She needs refills of her medications.   The following portions of the patient's history were reviewed and updated as appropriate: allergies, current medications, past family history, past medical history, past social history, past surgical history and problem list.  Review of Systems:    Objective:  Physical Exam BP 107/70 mmHg  Pulse 92  Resp 16  Ht 5\' 5"  (1.651 m)  Wt 153 lb (69.4 kg)  BMI 25.46 kg/m2  LMP 06/09/2014 GENERAL: Well-developed, well-nourished female in no acute distress.  HEENT: Normocephalic, atraumatic.  NECK: Supple. Normal thyroid.  LUNGS: Normal rate. Clear to auscultation bilaterally.  HEART: Regular rate and rhythm with no adventitious sounds.  EXTREMITIES: No cyanosis, clubbing, or edema, 2+ distal pulses.   Labs and Imaging No results found.  Assessment & Plan:  Assessment:  Migraine with and without Aura Anxiety/ depression OCD  Plans:  Will continue to await Botox approval Will go up on Topamax to 500 mg for 1-2 weeks then up to 600 mg Toradol 60 mg IM today Advised MRI would be prudent considering new aura Note to be in odor free work environment Trial Midrin for rescue Refill Pristique, zofran, phenergan RTC when Botox is approved    Olegario Messier, NP 07/02/2014 3:29  PM

## 2014-07-31 ENCOUNTER — Telehealth: Payer: Self-pay | Admitting: *Deleted

## 2014-07-31 NOTE — Telephone Encounter (Signed)
Pt notified of approval for Botox injections.  Appt made with Allie Dimmer, NP

## 2014-09-03 ENCOUNTER — Encounter: Payer: Self-pay | Admitting: Physician Assistant

## 2014-09-03 ENCOUNTER — Ambulatory Visit (INDEPENDENT_AMBULATORY_CARE_PROVIDER_SITE_OTHER): Payer: 59 | Admitting: Physician Assistant

## 2014-09-03 VITALS — BP 104/63 | HR 80 | Resp 16 | Ht 65.0 in | Wt 151.0 lb

## 2014-09-03 DIAGNOSIS — M6248 Contracture of muscle, other site: Secondary | ICD-10-CM | POA: Diagnosis not present

## 2014-09-03 DIAGNOSIS — G43109 Migraine with aura, not intractable, without status migrainosus: Secondary | ICD-10-CM

## 2014-09-03 DIAGNOSIS — M62838 Other muscle spasm: Secondary | ICD-10-CM

## 2014-09-03 NOTE — Patient Instructions (Signed)
OnabotulinumtoxinA injection (Medical Use) What is this medicine? ONABOTULINUMTOXINA (o na BOTT you lye num tox in eh) is a neuro-muscular blocker. This medicine is used to treat crossed eyes, eyelid spasms, severe neck muscle spasms, and elbow, wrist, and finger muscle spasms. It is also used to treat excessive underarm sweating, to prevent chronic migraine headaches, and to treat loss of bladder control due to neurologic conditions such as multiple sclerosis or spinal cord injury. This medicine may be used for other purposes; ask your health care provider or pharmacist if you have questions. COMMON BRAND NAME(S): Botox What should I tell my health care provider before I take this medicine? They need to know if you have any of these conditions: -breathing problems -cerebral palsy spasms -difficulty urinating -heart problems -history of surgery where this medicine is going to be used -infection at the site where this medicine is going to be used -myasthenia gravis or other neurologic disease -nerve or muscle disease -surgery plans -take medicines that treat or prevent blood clots -thyroid problems -an unusual or allergic reaction to botulinum toxin, albumin, other medicines, foods, dyes, or preservatives -pregnant or trying to get pregnant -breast-feeding How should I use this medicine? This medicine is for injection into a muscle. It is given by a health care professional in a hospital or clinic setting. Talk to your pediatrician regarding the use of this medicine in children. While this drug may be prescribed for children as young as 43 years old for selected conditions, precautions do apply. Overdosage: If you think you have taken too much of this medicine contact a poison control center or emergency room at once. NOTE: This medicine is only for you. Do not share this medicine with others. What if I miss a dose? This does not apply. What may interact with this  medicine? -aminoglycoside antibiotics like gentamicin, neomycin, tobramycin -muscle relaxants -other botulinum toxin injections This list may not describe all possible interactions. Give your health care provider a list of all the medicines, herbs, non-prescription drugs, or dietary supplements you use. Also tell them if you smoke, drink alcohol, or use illegal drugs. Some items may interact with your medicine. What should I watch for while using this medicine? Visit your doctor for regular check ups. This medicine will cause weakness in the muscle where it is injected. Tell your doctor if you feel unusually weak in other muscles. Get medical help right away if you have problems with breathing, swallowing, or talking. This medicine might make your eyelids droop or make you see blurry or double. If you have weak muscles or trouble seeing do not drive a car, use machinery, or do other dangerous activities. This medicine contains albumin from human blood. It may be possible to pass an infection in this medicine, but no cases have been reported. Talk to your doctor about the risks and benefits of this medicine. If your activities have been limited by your condition, go back to your regular routine slowly after treatment with this medicine. What side effects may I notice from receiving this medicine? Side effects that you should report to your doctor or health care professional as soon as possible: -allergic reactions like skin rash, itching or hives, swelling of the face, lips, or tongue -breathing problems -changes in vision -chest pain or tightness -eye irritation, pain -fast, irregular heartbeat -infection -numbness -speech problems -swallowing problems -unusual weakness Side effects that usually do not require medical attention (report to your doctor or health care professional if they continue or  are bothersome): -bruising or pain at site where injected -drooping eyelid -dry eyes or  mouth -headache -muscles aches, pains -sensitivity to light -tearing This list may not describe all possible side effects. Call your doctor for medical advice about side effects. You may report side effects to FDA at 1-800-FDA-1088. Where should I keep my medicine? This drug is given in a hospital or clinic and will not be stored at home. NOTE: This sheet is a summary. It may not cover all possible information. If you have questions about this medicine, talk to your doctor, pharmacist, or health care provider.  2015, [employer]. (2012-02-14 17:30:24)

## 2014-09-03 NOTE — Progress Notes (Signed)
Patient ID: Shirley Murray, female   DOB: [birth year], 44 y.o.   MRN: [id number] S: Pt in office today for Botox injections.  This is her first time to receive Botox.  She is very nervous and an in-depth conversation regarding procedure with risks and benefits was discussed.  She is electing to proceed.  She is aware that the full benefit may not be known until the 2nd or third set of injections.   At last visit, she was advised to increase Topamax dosing to 600mg .  This was not tolerated and she is using 400mg  qd.   She was also given Midrin to try.  This was no relief.     O: BP 104/63 mmHg  Pulse 80  Resp 16  Ht 5\' 5"  (1.651 m)  Wt 151 lb (68.493 kg)  BMI 25.13 kg/m2  LMP 09/03/2014 HIT6 score is 68 Headache days: 12 severe, 4 moderate, 0 mild, 14 days with no headache (in last 4 weeks)   Botox Procedure Note Lot # [id number] Expiration Date 10/18  Injection Sites for Migraines  Botox 100 units was injected using the dosage in the table above in the pattern shown above.  A: Migraine  Muscle spasm   P: Botox 100 units injected today.  RTC 3 Months.

## 2014-10-21 ENCOUNTER — Encounter: Payer: Self-pay | Admitting: Physician Assistant

## 2014-10-23 ENCOUNTER — Telehealth: Payer: Self-pay | Admitting: *Deleted

## 2014-10-23 DIAGNOSIS — Z8669 Personal history of other diseases of the nervous system and sense organs: Secondary | ICD-10-CM

## 2014-10-23 MED ORDER — ALPRAZOLAM 0.5 MG PO TABS
0.5000 mg | ORAL_TABLET | Freq: Every evening | ORAL | Status: DC | PRN
Start: 2014-10-23 — End: 2015-12-08

## 2014-10-23 NOTE — Telephone Encounter (Signed)
RX for Xanax 0.5 sent to [facility] per Allie Dimmer NP

## 2014-10-23 NOTE — Telephone Encounter (Signed)
-----   Message from Paticia Stack, PA-C sent at 10/22/2014  3:46 PM EDT ----- Regarding: Pt email Pt has emailed asking for refill of alprazolam.  Okay to call in xanax 0.5mg  1 po prn anxiety.  Disp #20.  No refill.  Pt also inquiring regarding changing acute medication.  Would like to discuss prior to prescribing new medication.  Pt may choose to schedule earlier appt if desired.  Thanks, Santiago Glad

## 2014-12-11 ENCOUNTER — Other Ambulatory Visit: Payer: Self-pay | Admitting: Physician Assistant

## 2014-12-13 ENCOUNTER — Other Ambulatory Visit: Payer: Self-pay | Admitting: Physician Assistant

## 2014-12-16 ENCOUNTER — Encounter: Payer: Self-pay | Admitting: *Deleted

## 2015-04-29 ENCOUNTER — Encounter: Payer: Self-pay | Admitting: Family Medicine

## 2015-04-29 ENCOUNTER — Ambulatory Visit (INDEPENDENT_AMBULATORY_CARE_PROVIDER_SITE_OTHER): Payer: 59 | Admitting: Family Medicine

## 2015-04-29 VITALS — Temp 99.8°F | Ht 65.0 in | Wt 152.4 lb

## 2015-04-29 DIAGNOSIS — R509 Fever, unspecified: Secondary | ICD-10-CM | POA: Diagnosis not present

## 2015-04-29 DIAGNOSIS — J029 Acute pharyngitis, unspecified: Secondary | ICD-10-CM | POA: Diagnosis not present

## 2015-04-29 DIAGNOSIS — J019 Acute sinusitis, unspecified: Secondary | ICD-10-CM

## 2015-04-29 LAB — POCT RAPID STREP A (OFFICE): RAPID STREP A SCREEN: NEGATIVE

## 2015-04-29 MED ORDER — CEFPROZIL 500 MG PO TABS: 500.0000 mg | ORAL_TABLET | Freq: Two times a day (BID) | ORAL | Status: DC

## 2015-04-29 NOTE — Progress Notes (Signed)
   Subjective:    Patient ID: Shirley Murray, female    DOB: January 13, 1971, 43 y.o.   MRN: [id number]  Cough This is a new problem. The current episode started in the past 7 days. Associated symptoms include a fever, headaches, myalgias, nasal congestion and a sore throat. Treatments tried: delysm, advil, tylenol.   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  HENT: Positive for sore throat.   Respiratory: Positive for cough.   Musculoskeletal: Positive for myalgias.  Neurological: Positive for headaches.       Objective:   Physical Exam Patient does not appear toxic Lungs are clear hearts regular sinus nontender eardrums normal throat erythematous neck is supple rapid strep test negative      Assessment & Plan:  Viral syndrome I doubt that this is a bacterial infection patient works in a cancer center I don't recommend that she worked there until the virus gets significantly improved. A prescription for antibiotics was prescribed that the patient may fill in a few days if she is getting worse

## 2015-04-30 LAB — STREP A DNA PROBE: Strep Gp A Direct, DNA Probe: NEGATIVE

## 2015-04-30 LAB — PLEASE NOTE

## 2015-05-14 ENCOUNTER — Encounter: Payer: Self-pay | Admitting: Family Medicine

## 2015-05-14 MED ORDER — PREDNISONE 20 MG PO TABS: ORAL_TABLET | ORAL | Status: DC

## 2015-05-14 MED ORDER — AMOXICILLIN-POT CLAVULANATE 875-125 MG PO TABS: 1.0000 | ORAL_TABLET | Freq: Two times a day (BID) | ORAL | Status: DC

## 2015-05-14 NOTE — Telephone Encounter (Signed)
I will send in rx to [facility] out pt Rx, Augmentin and prednisone, please follow up for recheck if not improving or if worse

## 2015-05-29 ENCOUNTER — Encounter: Payer: Self-pay | Admitting: Family Medicine

## 2015-05-29 ENCOUNTER — Ambulatory Visit (INDEPENDENT_AMBULATORY_CARE_PROVIDER_SITE_OTHER): Payer: 59 | Admitting: Family Medicine

## 2015-05-29 VITALS — BP 114/70 | Temp 98.7°F | Ht 65.0 in | Wt 156.2 lb

## 2015-05-29 DIAGNOSIS — J683 Other acute and subacute respiratory conditions due to chemicals, gases, fumes and vapors: Secondary | ICD-10-CM

## 2015-05-29 DIAGNOSIS — J4520 Mild intermittent asthma, uncomplicated: Secondary | ICD-10-CM

## 2015-05-29 DIAGNOSIS — J019 Acute sinusitis, unspecified: Secondary | ICD-10-CM | POA: Diagnosis not present

## 2015-05-29 MED ORDER — LEVOFLOXACIN 500 MG PO TABS: 500.0000 mg | ORAL_TABLET | Freq: Every day | ORAL | Status: DC

## 2015-05-29 MED ORDER — ALBUTEROL SULFATE HFA 108 (90 BASE) MCG/ACT IN AERS: 2.0000 | INHALATION_SPRAY | Freq: Four times a day (QID) | RESPIRATORY_TRACT | Status: DC | PRN

## 2015-05-29 MED ORDER — FLUCONAZOLE 150 MG PO TABS: ORAL_TABLET | ORAL | Status: DC

## 2015-05-29 NOTE — Progress Notes (Signed)
   Subjective:    Patient ID: Shirley Murray, female    DOB: Sep 25, 1970, 44 y.o.   MRN: [id number]  Sinusitis This is a new problem. The current episode started more than 1 month ago. The problem is unchanged. There has been no fever. The pain is moderate. Associated symptoms include congestion and coughing. (Runny nose ) Past treatments include antibiotics. The treatment provided no relief.    Bad cough at night time  Feels bad with the coughing  Some stomach virus  Doe s not smoke  Headache frontal in nature comes and goes.  Cough wheezing at times productive yellow phlegm   Review of Systems  HENT: Positive for congestion.   Respiratory: Positive for cough.        Objective:   Physical Exam  Alert, mild malaise. Hydration good Vitals stable. frontal/ maxillary tenderness evident positive nasal congestion. pharynx normal neck supple  lungs clear/no crackles or wheezes. heart regular in rhythm       Assessment & Plan:  Impression rhinosinusitis likely post viral, discussed with patient. Bronchitis element also plan antibiotics prescribed. Questions answered. Symptomatic care discussed. warning signs discussed. Albuterol added for reactive airway component though none auscultated currently WSL

## 2015-06-03 ENCOUNTER — Encounter: Payer: Self-pay | Admitting: *Deleted

## 2015-06-09 DIAGNOSIS — Z30430 Encounter for insertion of intrauterine contraceptive device: Secondary | ICD-10-CM | POA: Diagnosis not present

## 2015-06-09 DIAGNOSIS — Z3200 Encounter for pregnancy test, result unknown: Secondary | ICD-10-CM | POA: Diagnosis not present

## 2015-07-14 DIAGNOSIS — N644 Mastodynia: Secondary | ICD-10-CM | POA: Diagnosis not present

## 2015-07-14 DIAGNOSIS — Z30431 Encounter for routine checking of intrauterine contraceptive device: Secondary | ICD-10-CM | POA: Diagnosis not present

## 2015-10-03 ENCOUNTER — Ambulatory Visit (INDEPENDENT_AMBULATORY_CARE_PROVIDER_SITE_OTHER): Payer: 59 | Admitting: Nurse Practitioner

## 2015-10-03 ENCOUNTER — Encounter: Payer: Self-pay | Admitting: Nurse Practitioner

## 2015-10-03 VITALS — BP 100/68 | Ht 65.0 in | Wt 152.0 lb

## 2015-10-03 DIAGNOSIS — G4700 Insomnia, unspecified: Secondary | ICD-10-CM | POA: Diagnosis not present

## 2015-10-03 DIAGNOSIS — F419 Anxiety disorder, unspecified: Secondary | ICD-10-CM | POA: Diagnosis not present

## 2015-10-03 DIAGNOSIS — G43109 Migraine with aura, not intractable, without status migrainosus: Secondary | ICD-10-CM

## 2015-10-03 MED ORDER — TOPIRAMATE 200 MG PO TABS: 200.0000 mg | ORAL_TABLET | Freq: Three times a day (TID) | ORAL | Status: DC

## 2015-10-03 MED ORDER — CITALOPRAM HYDROBROMIDE 20 MG PO TABS: 20.0000 mg | ORAL_TABLET | Freq: Every day | ORAL | Status: DC

## 2015-10-03 MED ORDER — ONDANSETRON HCL 4 MG PO TABS: 4.0000 mg | ORAL_TABLET | Freq: Three times a day (TID) | ORAL | Status: DC | PRN

## 2015-10-03 MED ORDER — PROMETHAZINE HCL 25 MG PO TABS: 25.0000 mg | ORAL_TABLET | Freq: Four times a day (QID) | ORAL | Status: DC | PRN

## 2015-10-04 ENCOUNTER — Encounter: Payer: Self-pay | Admitting: Nurse Practitioner

## 2015-10-04 NOTE — Progress Notes (Signed)
Subjective:  Presents to discuss her migraine headaches. Has been followed by several specialists, has had injections in her scalp and neck area with minimal improvement. Currently on Topamax 200 mg 3 times a day. Denies any adverse effects. Headaches seem to be worse with certain smells or occasionally light. Cannot take triptans due to rash. Has been on several meds for anxiety, most recently was Pristiq which provided minimal relief and caused extreme constipation. Denies suicidal or homicidal thoughts or ideation. Continues to have some issues with anxiety and sleep disturbance.  Objective:   BP 100/68 mmHg  Ht 5\' 5"  (1.651 m)  Wt 152 lb (68.947 kg)  BMI 25.29 kg/m2 NAD. Alert, oriented. Mildly anxious affect. Lungs clear. Heart regular rate rhythm.  Assessment:  Problem List Items Addressed This Visit      Cardiovascular and Mediastinum   Migraine with aura - Primary   Relevant Medications   topiramate (TOPAMAX) 200 MG tablet   citalopram (CELEXA) 20 MG tablet   promethazine (PHENERGAN) 25 MG tablet     Other   Anxiety   Relevant Medications   citalopram (CELEXA) 20 MG tablet   Insomnia     Plan:  Meds ordered this encounter  Medications  . topiramate (TOPAMAX) 200 MG tablet    Sig: Take 1 tablet (200 mg total) by mouth 3 (three) times daily.    Dispense:  270 tablet    Refill:  1    Order Specific Question:  Supervising Provider    Answer:  Mikey Kirschner [[id number]]  . citalopram (CELEXA) 20 MG tablet    Sig: Take 1 tablet (20 mg total) by mouth daily.    Dispense:  90 tablet    Refill:  1    Order Specific Question:  Supervising Provider    Answer:  Mikey Kirschner [[id number]]  . promethazine (PHENERGAN) 25 MG tablet    Sig: Take 1 tablet (25 mg total) by mouth every 6 (six) hours as needed.    Dispense:  30 tablet    Refill:  2    Order Specific Question:  Supervising Provider    Answer:  Mikey Kirschner [[id number]]  . ondansetron (ZOFRAN) 4 MG tablet    Sig: Take 1  tablet (4 mg total) by mouth every 8 (eight) hours as needed for nausea or vomiting.    Dispense:  20 tablet    Refill:  2    Order Specific Question:  Supervising Provider    Answer:  Mikey Kirschner [[id number]]   Continue Topamax as directed. Start Celexa 20 mg daily, a review of the paper chart shows that she has taken this without difficulty in the past. Discussed importance of stress reduction and avoiding migraine triggers. Return in about 3 months (around 01/03/2016) for recheck. DC Celexa and call if any problems.

## 2015-11-18 ENCOUNTER — Encounter: Payer: Self-pay | Admitting: Nurse Practitioner

## 2015-11-19 ENCOUNTER — Other Ambulatory Visit: Payer: Self-pay | Admitting: Nurse Practitioner

## 2015-11-19 DIAGNOSIS — G43109 Migraine with aura, not intractable, without status migrainosus: Secondary | ICD-10-CM

## 2015-12-08 ENCOUNTER — Encounter: Payer: Self-pay | Admitting: Neurology

## 2015-12-08 ENCOUNTER — Ambulatory Visit (INDEPENDENT_AMBULATORY_CARE_PROVIDER_SITE_OTHER): Payer: 59 | Admitting: Neurology

## 2015-12-08 VITALS — BP 102/72 | HR 68 | Ht 65.0 in | Wt 154.5 lb

## 2015-12-08 DIAGNOSIS — R51 Headache: Secondary | ICD-10-CM | POA: Diagnosis not present

## 2015-12-08 DIAGNOSIS — Z79899 Other long term (current) drug therapy: Secondary | ICD-10-CM

## 2015-12-08 DIAGNOSIS — H539 Unspecified visual disturbance: Secondary | ICD-10-CM

## 2015-12-08 DIAGNOSIS — R519 Headache, unspecified: Secondary | ICD-10-CM

## 2015-12-08 MED ORDER — AMITRIPTYLINE HCL 25 MG PO TABS: 25.0000 mg | ORAL_TABLET | Freq: Every day | ORAL | Status: DC

## 2015-12-08 MED ORDER — DIHYDROERGOTAMINE MESYLATE 4 MG/ML NA SOLN: 1.0000 | NASAL | Status: DC | PRN

## 2015-12-08 NOTE — Patient Instructions (Addendum)
Remember to drink plenty of fluid, eat healthy meals and do not skip any meals. Try to eat protein with a every meal and eat a healthy snack such as fruit or nuts in between meals. Try to keep a regular sleep-wake schedule and try to exercise daily, particularly in the form of walking, 20-30 minutes a day, if you can.   As far as your medications are concerned, I would like to suggest: Amitriptyline at night. Start with 1/2 tab and can increase to a whole tablet in 1-2 week. If you are tired in the morning then try to take it an hour or two before bed. We can increase this further as needed. Migranal for acute management  As far as diagnostic testing: MRI brain, labs  Look up "cgrp and migraines"  Our phone number is [phone]. We also have an after hours call service for urgent matters and there is a physician on-call for urgent questions. For any emergencies you know to call 911 or go to the nearest emergency room  Dihydroergotamine nasal spray What is this medicine? DIHYDROERGOTAMINE (dye hye droe er GOT a meen) is part of a group of medicines called ergot alkaloids. It is used to treat migraine headaches with or without aura. It should not be used to prevent migraine headaches. This medicine may be used for other purposes; ask your health care provider or pharmacist if you have questions. What should I tell my health care provider before I take this medicine? They need to know if you have any of these conditions: -chest pain or difficulty breathing -heart or blood vessel disease -high blood pressure -infection -kidney disease -liver disease -poor circulation -risk factors for heart disease such as smoking, high cholesterol, a family history of heart disease, or if you are postmenopausal or a female over 11 years of age -an unusual or allergic reaction to dihydroergotamine, ergot alkaloids, other medicines, foods, dyes, or preservatives -pregnant or trying to get  pregnant -breast-feeding How should I use this medicine? This medicine is for use in the nose. Follow the directions on the prescription label. This medicine is given at the first symptoms of a migraine. It is not for everyday use. You must prepare the nasal spray only when you are ready to use it. Follow the instructions that come with your prescription or contact your doctor or health care professional if you are unsure how to do this. Throw away the sprayer after completing the full dose. Each unit is only good for eight hours once opened. Do not use this medicine more often than directed. Talk to your pediatrician regarding the use of this medicine in children. Special care may be needed. Overdosage: If you think you have taken too much of this medicine contact a poison control center or emergency room at once. NOTE: This medicine is only for you. Do not share this medicine with others. What if I miss a dose? This does not apply; this medicine is not for regular use. What may interact with this medicine? Do not take this medicine with any of the following medications: -antifungal drugs like fluconazole, itraconazole, ketoconazole or voriconazole -certain antibiotics like erythromycin, clarithromycin, and troleandomycin -cocaine -conivaptan -dexfenfluramine -ephedrine -feverfew -grapefruit juice -imatinib -isoproterenol -medicines called nitrates like isosorbide and nitroglycerin -medicines for colds, flu, or breathing difficulties like phenylephrine and pseudoephedrine -medicines for migraine headache like almotriptan, eletriptan, frovatriptan, naratriptan, rizatriptan, sumatriptan, and zolmitriptan -midodrine -nefazodone -other ergot alkaloids like bromocriptine, cabergoline, dihydroergotamine, ergoloid mesylates, ergonovine, methylergonovine, and methysergide -some  medicines for HIV This medicine may also interact with the following  medications: -clotrimazole -fluoxetine -fluvoxamine -medicines for high blood pressure, especially beta-blockers -metronidazole -nicotine -zileuton This list may not describe all possible interactions. Give your health care provider a list of all the medicines, herbs, non-prescription drugs, or dietary supplements you use. Also tell them if you smoke, drink alcohol, or use illegal drugs. Some items may interact with your medicine. What should I watch for while using this medicine? Check with your doctor or health care professional if you do not get relief from your headaches after using this medicine. You may need to be changed to a different kind of medicine to treat your migraines. You may get drowsy or dizzy. Do not drive, use machinery, or do anything that needs mental alertness until you know how this medicine affects you. To reduce dizzy or fainting spells, do not sit or stand up quickly, especially if you are an older patient. Alcohol can increase drowsiness, dizziness and flushing. Avoid alcoholic drinks. This medicine decreases the circulation of blood to your skin, fingers, and toes. You may get more sensitive to the cold. Elderly patients are more likely to feel this effect. Dress warmly and avoid long exposure to the cold. What side effects may I notice from receiving this medicine? Side effects that you should report to your doctor or health care professional as soon as possible: -allergic reactions like skin rash, itching or hives, swelling of the face, lips, or tongue -chest pain -cold hands or feet -fast, irregular heartbeat -leg or arm pain, cramps -swelling of hands, ankles, or feet -tingling, pain or numbness in feet or hands -vomiting -weakness in legs Side effects that usually do not require medical attention (report to your doctor or health care professional if they continue or are bothersome): -changes in the taste of food -nasal congestion or sore  throat -nausea This list may not describe all possible side effects. Call your doctor for medical advice about side effects. You may report side effects to FDA at 1-800-FDA-1088. Where should I keep my medicine? Keep out of the reach of children. Store at room temperature below 25 degrees C (77 degrees F). Protect from light, moisture, and heat. Do not refrigerate or freeze. Keep the parts of the nasal spray in the tray provided. Keep this tray loaded in the assembly case. Do not keep an opened nasal spray for more than 8 hours. Throw away any unopened medicine after the expiration date. NOTE: This sheet is a summary. It may not cover all possible information. If you have questions about this medicine, talk to your doctor, pharmacist, or health care provider.    2016, Elsevier/Gold Standard. (2007-09-05 17:17:14)   Amitriptyline tablets What is this medicine? AMITRIPTYLINE (a mee TRIP ti leen) is used to treat depression. This medicine may be used for other purposes; ask your health care provider or pharmacist if you have questions. What should I tell my health care provider before I take this medicine? They need to know if you have any of these conditions: -an alcohol problem -asthma, difficulty breathing -bipolar disorder or schizophrenia -difficulty passing urine, prostate trouble -glaucoma -heart disease or previous heart attack -liver disease -over active thyroid -seizures -thoughts or plans of suicide, a previous suicide attempt, or family history of suicide attempt -an unusual or allergic reaction to amitriptyline, other medicines, foods, dyes, or preservatives -pregnant or trying to get pregnant -breast-feeding How should I use this medicine? Take this medicine by mouth with a  drink of water. Follow the directions on the prescription label. You can take the tablets with or without food. Take your medicine at regular intervals. Do not take it more often than directed. Do not  stop taking this medicine suddenly except upon the advice of your doctor. Stopping this medicine too quickly may cause serious side effects or your condition may worsen. A special MedGuide will be given to you by the pharmacist with each prescription and refill. Be sure to read this information carefully each time. Talk to your pediatrician regarding the use of this medicine in children. Special care may be needed. Overdosage: If you think you have taken too much of this medicine contact a poison control center or emergency room at once. NOTE: This medicine is only for you. Do not share this medicine with others. What if I miss a dose? If you miss a dose, take it as soon as you can. If it is almost time for your next dose, take only that dose. Do not take double or extra doses. What may interact with this medicine? Do not take this medicine with any of the following medications: -arsenic trioxide -certain medicines used to regulate abnormal heartbeat or to treat other heart conditions -cisapride -droperidol -halofantrine -linezolid -MAOIs like Carbex, Eldepryl, Marplan, Nardil, and Parnate -methylene blue -other medicines for mental depression -phenothiazines like perphenazine, thioridazine and chlorpromazine -pimozide -probucol -procarbazine -sparfloxacin -St. John's Wort -ziprasidone This medicine may also interact with the following medications: -atropine and related drugs like hyoscyamine, scopolamine, tolterodine and others -barbiturate medicines for inducing sleep or treating seizures, like phenobarbital -cimetidine -disulfiram -ethchlorvynol -thyroid hormones such as levothyroxine This list may not describe all possible interactions. Give your health care provider a list of all the medicines, herbs, non-prescription drugs, or dietary supplements you use. Also tell them if you smoke, drink alcohol, or use illegal drugs. Some items may interact with your medicine. What should I  watch for while using this medicine? Tell your doctor if your symptoms do not get better or if they get worse. Visit your doctor or health care professional for regular checks on your progress. Because it may take several weeks to see the full effects of this medicine, it is important to continue your treatment as prescribed by your doctor. Patients and their families should watch out for new or worsening thoughts of suicide or depression. Also watch out for sudden changes in feelings such as feeling anxious, agitated, panicky, irritable, hostile, aggressive, impulsive, severely restless, overly excited and hyperactive, or not being able to sleep. If this happens, especially at the beginning of treatment or after a change in dose, call your health care professional. Shirley Murray may get drowsy or dizzy. Do not drive, use machinery, or do anything that needs mental alertness until you know how this medicine affects you. Do not stand or sit up quickly, especially if you are an older patient. This reduces the risk of dizzy or fainting spells. Alcohol may interfere with the effect of this medicine. Avoid alcoholic drinks. Do not treat yourself for coughs, colds, or allergies without asking your doctor or health care professional for advice. Some ingredients can increase possible side effects. Your mouth may get dry. Chewing sugarless gum or sucking hard candy, and drinking plenty of water will help. Contact your doctor if the problem does not go away or is severe. This medicine may cause dry eyes and blurred vision. If you wear contact lenses you may feel some discomfort. Lubricating drops may help. See  your eye doctor if the problem does not go away or is severe. This medicine can cause constipation. Try to have a bowel movement at least every 2 to 3 days. If you do not have a bowel movement for 3 days, call your doctor or health care professional. This medicine can make you more sensitive to the sun. Keep out of the  sun. If you cannot avoid being in the sun, wear protective clothing and use sunscreen. Do not use sun lamps or tanning beds/booths. What side effects may I notice from receiving this medicine? Side effects that you should report to your doctor or health care professional as soon as possible: -allergic reactions like skin rash, itching or hives, swelling of the face, lips, or tongue -abnormal production of milk in females -breast enlargement in both males and females -breathing problems -confusion, hallucinations -fast, irregular heartbeat -fever with increased sweating -muscle stiffness, or spasms -pain or difficulty passing urine, loss of bladder control -seizures -suicidal thoughts or other mood changes -swelling of the testicles -tingling, pain, or numbness in the feet or hands -yellowing of the eyes or skin Side effects that usually do not require medical attention (report to your doctor or health care professional if they continue or are bothersome): -change in sex drive or performance -constipation or diarrhea -nausea, vomiting -weight gain or loss This list may not describe all possible side effects. Call your doctor for medical advice about side effects. You may report side effects to FDA at 1-800-FDA-1088. Where should I keep my medicine? Keep out of the reach of children. Store at room temperature between 20 and 25 degrees C (68 and 77 degrees F). Throw away any unused medicine after the expiration date. NOTE: This sheet is a summary. It may not cover all possible information. If you have questions about this medicine, talk to your doctor, pharmacist, or health care provider.    2016, Elsevier/Gold Standard. (2011-10-04 13:50:32)

## 2015-12-08 NOTE — Progress Notes (Signed)
[id number]    Provider:  Dr Jaynee Eagles Referring Provider: Kathyrn Drown, MD Primary Care Physician:  Sallee Lange, MD  CC:  migraine  HPI:  Shirley Murray is a 45 y.o. female here as a referral from Dr. Wolfgang Phoenix for migraine. PMHx migraines, anxiety, sleep disturbance an dinsomnia, OCD. She has been followed by several specialists per notes. Migraines since she was 45 years old without any inciting event or head trauma. . Migraines are progressively getting worse. They start on the left side, they are throbbing, she gets flashing lights before the migraine, nausea, vomiting, 3 weeks. She has not tried amitriptyline, nortriptyline. She has never tried propranolol. Never tried migranal. She has daily headaches and at least 12 migraines a month. Smells and lights are trigger. Did a food diary but never saw any food triggers. Sleeping helps as does a dark room. Migraines can last up to 24 hours. They hit her hard, immediately bad. Paternal grandfather with migraines.  When she bends over she gets very dizzy and can pass out at least 3 times. It has limited her from working out, she gets migraines from jumping jacks. No other focal neurologic deficits or complaints.  Tried: Imitrex injections, imitrex oral, Topamax, toradol, zomig. Maxalt, phenergan, vicoden for rescue. Relpax.  She gets a rash with triptans. 1 Botox injection.  Reviewed notes, labs and imaging from outside physicians, which showed: First documentation of migraine in EMR was in August 2012 when she complained of migraine without aura, migraine since 45 years old, strong family history, she complained of bilateral temples and occipital headache. She complained almost daily headache. Headaches with the rapid onset. At the time she was taking Imitrex and Topamax 150 mg for prevention. In the past she tried Toradol, Zomig and Maxalt. She complained of insomnia. And OCD. Topamax was increased to 200 mg daily at bedtime.  Phenergan and Vicodin were added for rescue. Patient was seen again in February 2013 for migraine OCD and insomnia. She changed from Paxil to Celexa for her OCD. By the time she was taking oral Imitrex it was too late. She was prescribed Imitrex injections for her morning headaches. In October 2014 she was on Pristiq for OCD, she asked for her Topamax to be increased. Topamax was increased to 200 mg twice a day. In 2016 she was having 12 severe, 16 moderate migraines per month lasting longer than 4 hours a day and reported rash to most triptan's, steroid taper did not help. Botox was suggested. She had a Botox injection in April 2016. She was increased to Topamax 600 mg a day. Last seen in family medicine for migraines May 2017 and referred to neurology.  Review of Systems: Patient complains of symptoms per HPI as well as the following symptoms: headache, dizziness, passing out, snoring. Pertinent negatives per HPI. All others negative.   Social History   Social History  . Marital Status: Married    Spouse Name: Shirley Murray  . Number of Children: 3  . Years of Education: 16   Occupational History  . [place] History Main Topics  . Smoking status: Never Smoker   . Smokeless tobacco: Never Used  . Alcohol Use: Yes     Comment: rare  . Drug Use: No  . Sexual Activity:    Partners: Male    Birth Control/ Protection: Other-see comments     Comment: partener has vasectomy   Other Topics Concern  . Not on file  Social History Narrative   Lives w/ spouse   Caffeine use: 8 ounces per day    Family History  Problem Relation Age of Onset  . Cancer Paternal Grandfather     bone  . Migraines Paternal Grandfather   . Hypertension Paternal Grandmother   . Cancer Paternal Grandmother     colon/deceased  . Stroke Maternal Grandmother   . Diabetes Maternal Grandmother     adult onset  . Parkinsonism Maternal Grandmother   . Cancer Maternal Grandfather     oral  .  Hypertension Maternal Grandfather   . Diabetes Maternal Grandfather     adult onset  . Hypertension Father   . Mental illness Father     bipolar  . Cancer Mother 49    uterine cancer  . Colon cancer Paternal Aunt 14  . Cancer Paternal Aunt     colon cancer  . Cancer Paternal Uncle     colon cancer    Past Medical History  Diagnosis Date  . Breast disorder     fibrocystic breast   . Mental disorder     ocd  . OCD (obsessive compulsive disorder)   . Allergy     codeine  . Anxiety   . [id number]     Past Surgical History  Procedure Laterality Date  . Cyst removed from right breast  1999  . Birth mark removed from left arm  1974    Current Outpatient Prescriptions  Medication Sig Dispense Refill  . cetirizine (ZYRTEC) 10 MG tablet Take 10 mg by mouth at bedtime.    . Cholecalciferol (VITAMIN D PO) Take 1 tablet by mouth daily. 5000 units    . citalopram (CELEXA) 20 MG tablet Take 1 tablet (20 mg total) by mouth daily. 90 tablet 1  . fluticasone (FLONASE) 50 MCG/ACT nasal spray Place 2 sprays into both nostrils daily.    . ondansetron (ZOFRAN) 4 MG tablet Take 1 tablet (4 mg total) by mouth every 8 (eight) hours as needed for nausea or vomiting. 20 tablet 2  . promethazine (PHENERGAN) 25 MG tablet Take 1 tablet (25 mg total) by mouth every 6 (six) hours as needed. 30 tablet 2  . topiramate (TOPAMAX) 200 MG tablet Take 1 tablet (200 mg total) by mouth 3 (three) times daily. (Patient taking differently: Take 200 mg by mouth 3 (three) times daily. 400mg  in the morning 200mg  at night) 270 tablet 1  . albuterol (PROVENTIL HFA;VENTOLIN HFA) 108 (90 Base) MCG/ACT inhaler Inhale 2 puffs into the lungs 4 (four) times daily as needed for wheezing or shortness of breath. 1 Inhaler 0  . amitriptyline (ELAVIL) 25 MG tablet Take 1 tablet (25 mg total) by mouth at bedtime. Start with 1/2 a tab at night and increase to a whole tablet before bed in 1-2 weeks. 30 tablet 11  .  amoxicillin-clavulanate (AUGMENTIN) 875-125 MG tablet Take 1 tablet by mouth 2 (two) times daily. (Patient not taking: Reported on 05/29/2015) 20 tablet 0  . dihydroergotamine (MIGRANAL) 4 MG/ML nasal spray Place 1 spray into the nose as needed for migraine. Use in one nostril as directed.  No more than 4 sprays in one hour 8 mL 12  . fexofenadine (ALLEGRA) 180 MG tablet Take 180 mg by mouth daily. Reported on 10/03/2015    . mometasone (NASONEX) 50 MCG/ACT nasal spray Place 2 sprays into the nose daily.    . Vitamin D, Ergocalciferol, (DRISDOL) 50000 UNITS CAPS capsule Reported on 10/03/2015  0  No current facility-administered medications for this visit.    Allergies as of 12/08/2015 - Review Complete 12/08/2015  Allergen Reaction Noted  . Codeine Rash 01/11/2011  . Maxalt [rizatriptan] Hives and Swelling 04/10/2012  . Imitrex [sumatriptan] Rash 09/25/2013  . Relpax [eletriptan] Rash 09/25/2013    Vitals: BP 102/72 mmHg  Pulse 68  Ht 5\' 5"  (1.651 m)  Wt 154 lb 8 oz (70.081 kg)  BMI 25.71 kg/m2 Last Weight:  Wt Readings from Last 1 Encounters:  12/08/15 154 lb 8 oz (70.081 kg)   Last Height:   Ht Readings from Last 1 Encounters:  12/08/15 5\' 5"  (1.651 m)    Physical exam: Exam: Gen: NAD, conversant, well nourised, obese, well groomed                     CV: RRR, no MRG. No Carotid Bruits. No peripheral edema, warm, nontender Eyes: Conjunctivae clear without exudates or hemorrhage  Neuro: Detailed Neurologic Exam  Speech:    Speech is normal; fluent and spontaneous with normal comprehension.  Cognition:    The patient is oriented to person, place, and time;     recent and remote memory intact;     language fluent;     normal attention, concentration,     fund of knowledge Cranial Nerves:    The pupils are equal, round, and reactive to light. The fundi are normal and spontaneous venous pulsations are present. Visual fields are full to finger confrontation. Extraocular  movements are intact. Trigeminal sensation is intact and the muscles of mastication are normal. The face is symmetric. The palate elevates in the midline. Hearing intact. Voice is normal. Shoulder shrug is normal. The tongue has normal motion without fasciculations.   Coordination:    Normal finger to nose and heel to shin. Normal rapid alternating movements.   Gait:    Heel-toe and tandem gait are normal.   Motor Observation:    No asymmetry, no atrophy, and no involuntary movements noted. Tone:    Normal muscle tone.    Posture:    Posture is normal. normal erect    Strength:    Strength is V/V in the upper and lower limbs.      Sensation: intact to LT     Reflex Exam:  DTR's:    Deep tendon reflexes in the upper and lower extremities are normal bilaterally.   Toes:    The toes are downgoing bilaterally.   Clonus:    Clonus is absent.      Assessment/Plan:   45 y.o. female here as a referral from Dr. Wolfgang Phoenix for migraine. PMHx migraines, anxiety, sleep disturbance and insomnia, OCD. She has been followed by several specialists per notes.Neurologic exam is nonfocal. Chronic migraines with and without aura with status migrainosus not intractable.  MRI of the brain: She has not had imaging and headaches are worsening Amitriptyline qhs for migraine. May also help with insomnia. Migranal for acute management CMP and CBC today. Can continue Topamax however this is quite a large dose (600 mg a day), she denies side effects.  To prevent or relieve headaches, try the following: Cool Compress. Lie down and place a cool compress on your head.  Avoid headache triggers. If certain foods or odors seem to have triggered your migraines in the past, avoid them. A headache diary might help you identify triggers.  Include physical activity in your daily routine. Try a daily walk or other moderate aerobic exercise.  Manage stress.  Find healthy ways to cope with the stressors, such as  delegating tasks on your to-do list.  Practice relaxation techniques. Try deep breathing, yoga, massage and visualization.  Eat regularly. Eating regularly scheduled meals and maintaining a healthy diet might help prevent headaches. Also, drink plenty of fluids.  Follow a regular sleep schedule. Sleep deprivation might contribute to headaches Consider biofeedback. With this mind-body technique, you learn to control certain bodily functions - such as muscle tension, heart rate and blood pressure - to prevent headaches or reduce headache pain.    Proceed to emergency room if you experience new or worsening symptoms or symptoms do not resolve, if you have new neurologic symptoms or if headache is severe, or for any concerning symptom.     Sarina Ill, MD  [facility] [address] [place], [place] [address]  Phone [phone] Fax [phone]

## 2015-12-09 ENCOUNTER — Telehealth: Payer: Self-pay | Admitting: *Deleted

## 2015-12-09 LAB — COMPREHENSIVE METABOLIC PANEL
ALBUMIN: 4 g/dL (ref 3.5–5.5)
ALT: 19 IU/L (ref 0–32)
AST: 15 IU/L (ref 0–40)
Albumin/Globulin Ratio: 1.8 (ref 1.2–2.2)
Alkaline Phosphatase: 79 IU/L (ref 39–117)
BILIRUBIN TOTAL: 0.4 mg/dL (ref 0.0–1.2)
BUN / CREAT RATIO: 19 (ref 9–23)
BUN: 11 mg/dL (ref 6–24)
CHLORIDE: 109 mmol/L — AB (ref 96–106)
CO2: 21 mmol/L (ref 18–29)
CREATININE: 0.59 mg/dL (ref 0.57–1.00)
Calcium: 8.6 mg/dL — ABNORMAL LOW (ref 8.7–10.2)
GFR calc non Af Amer: 110 mL/min/{1.73_m2} (ref >59)
GFR, EST AFRICAN AMERICAN: 127 mL/min/{1.73_m2} (ref >59)
GLUCOSE: 86 mg/dL (ref 65–99)
Globulin, Total: 2.2 g/dL (ref 1.5–4.5)
Potassium: 4.1 mmol/L (ref 3.5–5.2)
Sodium: 145 mmol/L — ABNORMAL HIGH (ref 134–144)
TOTAL PROTEIN: 6.2 g/dL (ref 6.0–8.5)

## 2015-12-09 LAB — CBC
HEMATOCRIT: 45.5 % (ref 34.0–46.6)
HEMOGLOBIN: 15.3 g/dL (ref 11.1–15.9)
MCH: 30.8 pg (ref 26.6–33.0)
MCHC: 33.6 g/dL (ref 31.5–35.7)
MCV: 92 fL (ref 79–97)
Platelets: 219 10*3/uL (ref 150–379)
RBC: 4.97 x10E6/uL (ref 3.77–5.28)
RDW: 13.6 % (ref 12.3–15.4)
WBC: 7.4 10*3/uL (ref 3.4–10.8)

## 2015-12-09 NOTE — Telephone Encounter (Signed)
Called and spoke to pt about unremarkable labs per Dr Jaynee Eagles. She verbalized understanding.

## 2015-12-09 NOTE — Telephone Encounter (Signed)
-----   Message from Melvenia Beam, MD sent at 12/09/2015  3:38 PM EDT ----- Labs unremarkable,  thanks

## 2015-12-18 ENCOUNTER — Telehealth: Payer: Self-pay | Admitting: Neurology

## 2015-12-18 NOTE — Telephone Encounter (Signed)
Called patient and informed her that she could call [facility] to schedule MRI. She stated that she was on vacation and would call them in august when she returns.

## 2016-01-02 ENCOUNTER — Ambulatory Visit: Payer: 59 | Admitting: Nurse Practitioner

## 2016-01-09 ENCOUNTER — Ambulatory Visit: Payer: 59 | Admitting: Nurse Practitioner

## 2016-03-09 ENCOUNTER — Encounter: Payer: Self-pay | Admitting: Neurology

## 2016-03-09 ENCOUNTER — Ambulatory Visit: Payer: 59 | Admitting: Neurology

## 2016-03-09 ENCOUNTER — Telehealth: Payer: Self-pay | Admitting: Neurology

## 2016-03-09 MED ORDER — AMITRIPTYLINE HCL 25 MG PO TABS: 50.0000 mg | ORAL_TABLET | Freq: Every day | ORAL | 5 refills | Status: DC

## 2016-03-09 NOTE — Telephone Encounter (Signed)
That's great that she increased the medication, fine about the mri thanks

## 2016-03-09 NOTE — Telephone Encounter (Signed)
Dr Jaynee Eagles- FYi  Called patient. Relayed Dr Guadelupe Sabin message below. She verbalized understanding. States she will pick up tomorrow.   She also stated she has not completed MRI brain yet because she used all her flex account and had to pay for her daughters urology appt. She is planning on going around 04/30/16. Advised I will let Dr Jaynee Eagles know. She has upcoming f/u on 03/23/16.

## 2016-03-09 NOTE — Telephone Encounter (Signed)
Patient reports increase in migraine HAs. I recommend she try to increase the amitriptyline 25 mg strength to 1-1/2 pills at bedtime for a week then 2 pills at bedtime thereafter. I adjusted her prescription in that regard. Please call patient back.

## 2016-03-23 ENCOUNTER — Ambulatory Visit (INDEPENDENT_AMBULATORY_CARE_PROVIDER_SITE_OTHER): Payer: 59 | Admitting: Neurology

## 2016-03-23 ENCOUNTER — Encounter: Payer: Self-pay | Admitting: Neurology

## 2016-03-23 ENCOUNTER — Other Ambulatory Visit: Payer: Self-pay | Admitting: *Deleted

## 2016-03-23 DIAGNOSIS — G43711 Chronic migraine without aura, intractable, with status migrainosus: Secondary | ICD-10-CM | POA: Diagnosis not present

## 2016-03-23 MED ORDER — ONABOTULINUMTOXINA 100 UNITS IJ SOLR: INTRAMUSCULAR | 3 refills | Status: DC

## 2016-03-23 NOTE — Patient Instructions (Signed)
Remember to drink plenty of fluid, eat healthy meals and do not skip any meals. Try to eat protein with a every meal and eat a healthy snack such as fruit or nuts in between meals. Try to keep a regular sleep-wake schedule and try to exercise daily, particularly in the form of walking, 20-30 minutes a day, if you can.   As far as your medications are concerned, I would like to suggest: Continue current medications  As far as diagnostic testing: MRI brain  I would like to see you back for botox, sooner if we need to. Please call us with any interim questions, concerns, problems, updates or refill requests.   Our phone number is [phone]. We also have an after hours call service for urgent matters and there is a physician on-call for urgent questions. For any emergencies you know to call 911 or go to the nearest emergency room

## 2016-03-23 NOTE — Progress Notes (Signed)
[facility]     Provider:  Dr Jaynee Eagles Referring Provider: Kathyrn Drown, MD Primary Care Physician:  Sallee Lange, MD  CC:  Migraine  Interval update: Amitriptyline was increased and it has not helped her headaches. She still wakes up at 3am. Amitriptyline helps her sleep but she gets up at 3am for 15-30 minutes she doesn't know why. There is stress at work. She appears anxious but denies anxiety. Migranal did work. She is on 3 headache medications (celexa, topamax and amitriptyline) and she is still having 3-4 migraines a week. On average 16 migraines a month. She has daily headaches. She has TMJ. She has had migraines that last 3-4 days straight. We will try botox.   Tried: Imitrex injections, imitrex oral, Topamax, toradol, zomig. Maxalt, phenergan, vicoden for rescue. Relpax.  She gets a rash with triptans. 1 Botox injection. Amitriptyline. Migranal.   HPI:  Shirley Murray is a 45 y.o. female here as a referral from Dr. Wolfgang Phoenix for migraine. PMHx migraines, anxiety, sleep disturbance an dinsomnia, OCD. She has been followed by several specialists per notes. Migraines since she was 45 years old without any inciting event or head trauma. . Migraines are progressively getting worse. They start on the left side, they are throbbing, she gets flashing lights before some migraines but not all of them, nausea, vomiting, 3 weeks. She has not tried amitriptyline, nortriptyline. She has never tried propranolol. Never tried migranal. She has daily headaches and at least 12 migraines a month. Smells and lights are trigger. Did a food diary but never saw any food triggers. Sleeping helps as does a dark room. Migraines can last up to 24 hours. They hit her hard, immediately bad. Paternal grandfather with migraines.  When she bends over she gets very dizzy and can pass out at least 3 times. It has limited her from working out, she gets migraines from jumping jacks. No other focal  neurologic deficits or complaints.  Tried: Imitrex injections, imitrex oral, Topamax, toradol, zomig. Maxalt, phenergan, vicoden for rescue. Relpax.  She gets a rash with triptans. 1 Botox injection.  Reviewed notes, labs and imaging from outside physicians, which showed: First documentation of migraine in EMR was in August 2012 when she complained of migraine without aura, migraine since 45 years old, strong family history, she complained of bilateral temples and occipital headache. She complained almost daily headache. Headaches with the rapid onset. At the time she was taking Imitrex and Topamax 150 mg for prevention. In the past she tried Toradol, Zomig and Maxalt. She complained of insomnia. And OCD. Topamax was increased to 200 mg daily at bedtime. Phenergan and Vicodin were added for rescue. Patient was seen again in February 2013 for migraine OCD and insomnia. She changed from Paxil to Celexa for her OCD. By the time she was taking oral Imitrex it was too late. She was prescribed Imitrex injections for her morning headaches. In October 2014 she was on Pristiq for OCD, she asked for her Topamax to be increased. Topamax was increased to 200 mg twice a day. In 2016 she was having 12 severe, 16 moderate migraines per month lasting longer than 4 hours a day and reported rash to most triptan's, steroid taper did not help. Botox was suggested. She had a Botox injection in April 2016. She was increased to Topamax 600 mg a day. Last seen in family medicine for migraines May 2017 and referred to neurology.  Review of Systems: Patient complains of symptoms per HPI  as well as the following symptoms: headache, dizziness, passing out, snoring. Pertinent negatives per HPI. All others negative.   Social History   Social History  . Marital status: Married    Spouse name: [name]  . Number of children: 3  . Years of education: 60   Occupational History  . [place] History [employer]  Topics  . Smoking status: Never Smoker  . Smokeless tobacco: Never Used  . Alcohol use Yes     Comment: rare  . Drug use: No  . Sexual activity: Yes    Partners: Male    Birth control/ protection: Other-see comments     Comment: partener has vasectomy   Other Topics Concern  . Not on file   Social History Narrative   Lives w/ spouse   Caffeine use: 8 ounces per day    Family History  Problem Relation Age of Onset  . Cancer Paternal Grandfather     bone  . Migraines Paternal Grandfather   . Hypertension Paternal Grandmother   . Cancer Paternal Grandmother     colon/deceased  . Stroke Maternal Grandmother   . Diabetes Maternal Grandmother     adult onset  . Parkinsonism Maternal Grandmother   . Cancer Maternal Grandfather     oral  . Hypertension Maternal Grandfather   . Diabetes Maternal Grandfather     adult onset  . Hypertension Father   . Mental illness Father     bipolar  . Cancer Mother 63    uterine cancer  . Colon cancer Paternal Aunt 60  . Cancer Paternal Aunt     colon cancer  . Cancer Paternal Uncle     colon cancer    Past Medical History:  Diagnosis Date  . Allergy    codeine  . Anxiety   . Breast disorder    fibrocystic breast   . Headache(784.0)   . Mental disorder    ocd  . OCD (obsessive compulsive disorder)     Past Surgical History:  Procedure Laterality Date  . birth mark removed from left arm  1974  . cyst removed from right breast  1999    Current Outpatient Prescriptions  Medication Sig Dispense Refill  . amitriptyline (ELAVIL) 25 MG tablet Take 2 tablets (50 mg total) by mouth at bedtime. 2 tabs at night. 60 tablet 5  . cetirizine (ZYRTEC) 10 MG tablet Take 10 mg by mouth at bedtime.    . Cholecalciferol (VITAMIN D PO) Take 1 tablet by mouth daily. 5000 units    . citalopram (CELEXA) 20 MG tablet Take 1 tablet (20 mg total) by mouth daily. 90 tablet 1  . dihydroergotamine (MIGRANAL) 4 MG/ML nasal spray Place 1 spray into  the nose as needed for migraine. Use in one nostril as directed.  No more than 4 sprays in one hour 8 mL 12  . fluticasone (FLONASE) 50 MCG/ACT nasal spray Place 2 sprays into both nostrils daily.    . norethindrone-ethinyl estradiol (LOESTRIN 1/20, 21,) 1-20 MG-MCG tablet Take 1 tablet by mouth daily.    . ondansetron (ZOFRAN) 4 MG tablet Take 1 tablet (4 mg total) by mouth every 8 (eight) hours as needed for nausea or vomiting. 20 tablet 2  . promethazine (PHENERGAN) 25 MG tablet Take 1 tablet (25 mg total) by mouth every 6 (six) hours as needed. 30 tablet 2  . topiramate (TOPAMAX) 200 MG tablet Take 1 tablet (200 mg total) by mouth 3 (  three) times daily. (Patient taking differently: Take 200 mg by mouth 3 (three) times daily. 400mg  in the morning 200mg  at night) 270 tablet 1  . Vitamin D, Ergocalciferol, (DRISDOL) 50000 UNITS CAPS capsule Reported on 10/03/2015  0   No current facility-administered medications for this visit.     Allergies as of 03/23/2016 - Review Complete 03/23/2016  Allergen Reaction Noted  . Codeine Rash 01/11/2011  . Maxalt [rizatriptan] Hives and Swelling 04/10/2012  . Imitrex [sumatriptan] Rash 09/25/2013  . Relpax [eletriptan] Rash 09/25/2013    Vitals: BP 94/63 (BP Location: Right Arm, Patient Position: Sitting, Cuff Size: Normal)   Pulse 89   Ht 5\' 5"  (1.651 m)   Wt 160 lb 6.4 oz (72.8 kg)   BMI 26.69 kg/m  Last Weight:  Wt Readings from Last 1 Encounters:  03/23/16 160 lb 6.4 oz (72.8 kg)   Last Height:   Ht Readings from Last 1 Encounters:  03/23/16 5\' 5"  (1.651 m)     Physical exam: Exam: Gen: NAD, conversant, well nourised, obese, well groomed                     CV: RRR, no MRG. No Carotid Bruits. No peripheral edema, warm, nontender Eyes: Conjunctivae clear without exudates or hemorrhage  Neuro: Detailed Neurologic Exam  Speech:    Speech is normal; fluent and spontaneous with normal comprehension.  Cognition:    The patient is  oriented to person, place, and time;     recent and remote memory intact;     language fluent;     normal attention, concentration,     fund of knowledge Cranial Nerves:    The pupils are equal, round, and reactive to light. The fundi are normal and spontaneous venous pulsations are present. Visual fields are full to finger confrontation. Extraocular movements are intact. Trigeminal sensation is intact and the muscles of mastication are normal. The face is symmetric. The palate elevates in the midline. Hearing intact. Voice is normal. Shoulder shrug is normal. The tongue has normal motion without fasciculations.   Coordination:    Normal finger to nose and heel to shin. Normal rapid alternating movements.   Gait:    Heel-toe and tandem gait are normal.   Motor Observation:    No asymmetry, no atrophy, and no involuntary movements noted. Tone:    Normal muscle tone.    Posture:    Posture is normal. normal erect    Strength:    Strength is V/V in the upper and lower limbs.      Sensation: intact to LT     Reflex Exam:  DTR's:    Deep tendon reflexes in the upper and lower extremities are normal bilaterally.   Toes:    The toes are downgoing bilaterally.   Clonus:    Clonus is absent.      Assessment/Plan:   45 y.o. female here as a referral from Dr. Wolfgang Phoenix for migraine. Chronic migraines without aura intractable with status migrainosus. PMHx migraines, anxiety, sleep disturbance and insomnia, OCD. She has been followed by several specialists per notes.Neurologic exam is nonfocal.    MRI of the brain: She has not had imaging and headaches are worsening: still pending Amitriptyline qhs for migraine. May also help with insomnia: increased to 50mg  qhs Migranal for acute management: did not work, has also tried multiple triptans CMP and CBC today were unremarkable Can continue Topamax however this is quite a large dose (600 mg a day),  she denies side effects. She has  tried multiple medications, is currently on 3 migraine medications without relief. WILL START BOTOX treatments  To prevent or relieve headaches, try the following:  Cool Compress. Lie down and place a cool compress on your head.   Avoid headache triggers. If certain foods or odors seem to have triggered your migraines in the past, avoid them. A headache diary might help you identify triggers.   Include physical activity in your daily routine. Try a daily walk or other moderate aerobic exercise.   Manage stress. Find healthy ways to cope with the stressors, such as delegating tasks on your to-do list.   Practice relaxation techniques. Try deep breathing, yoga, massage and visualization.   Eat regularly. Eating regularly scheduled meals and maintaining a healthy diet might help prevent headaches. Also, drink plenty of fluids.   Follow a regular sleep schedule. Sleep deprivation might contribute to headaches  Consider biofeedback. With this mind-body technique, you learn to control certain bodily functions - such as muscle tension, heart rate and blood pressure - to prevent headaches or reduce headache pain.    Proceed to emergency room if you experience new or worsening symptoms or symptoms do not resolve, if you have new neurologic symptoms or if headache is severe, or for any concerning symptom.   Sarina Ill, MD  [facility] [address] [place], [place] [address]  Phone [phone] Fax [phone]  A total of 30 minutes was spent face-to-face with this patient. Over half this time was spent on counseling patient on the chronic migraine diagnosis and different diagnostic and therapeutic options available.

## 2016-03-31 ENCOUNTER — Telehealth: Payer: Self-pay | Admitting: Neurology

## 2016-03-31 NOTE — Telephone Encounter (Signed)
Spoke with the patient who is inquiring about botox medication. I told her that we had sent the rx over to [facility], she said she would call them to see when she could pick it up.

## 2016-04-05 ENCOUNTER — Ambulatory Visit: Payer: 59 | Admitting: Neurology

## 2016-04-06 ENCOUNTER — Encounter: Payer: Self-pay | Admitting: Neurology

## 2016-04-08 NOTE — Telephone Encounter (Signed)
Hi Angie, called and spoke to patient and she relayed she received a letter that  She was  a no show . Patient relayed her apt was CX because her Botox verification was still pending so she couldn't come. Patient relayed she does not want this counted against her. I relayed to Patient that I would let Angie know and she will take care of for her. Patient was fine with telephone call and she understood. Thanks Angie.   Patient relayed she is still waiting on her Botox approval . I relayed to patient that Andee Poles was out sick and we would follow back up with her next week. Patient was fine with details.

## 2016-04-26 DIAGNOSIS — Z0000 Encounter for general adult medical examination without abnormal findings: Secondary | ICD-10-CM | POA: Diagnosis not present

## 2016-04-26 DIAGNOSIS — Z01419 Encounter for gynecological examination (general) (routine) without abnormal findings: Secondary | ICD-10-CM | POA: Diagnosis not present

## 2016-04-26 DIAGNOSIS — Z1329 Encounter for screening for other suspected endocrine disorder: Secondary | ICD-10-CM | POA: Diagnosis not present

## 2016-04-26 DIAGNOSIS — Z13220 Encounter for screening for lipoid disorders: Secondary | ICD-10-CM | POA: Diagnosis not present

## 2016-04-26 DIAGNOSIS — Z1151 Encounter for screening for human papillomavirus (HPV): Secondary | ICD-10-CM | POA: Diagnosis not present

## 2016-04-26 DIAGNOSIS — Z30432 Encounter for removal of intrauterine contraceptive device: Secondary | ICD-10-CM | POA: Diagnosis not present

## 2016-04-26 DIAGNOSIS — Z130 Encounter for screening for diseases of the blood and blood-forming organs and certain disorders involving the immune mechanism: Secondary | ICD-10-CM | POA: Diagnosis not present

## 2016-04-26 DIAGNOSIS — N898 Other specified noninflammatory disorders of vagina: Secondary | ICD-10-CM | POA: Diagnosis not present

## 2016-04-26 DIAGNOSIS — B977 Papillomavirus as the cause of diseases classified elsewhere: Secondary | ICD-10-CM | POA: Diagnosis not present

## 2016-04-26 DIAGNOSIS — Z1231 Encounter for screening mammogram for malignant neoplasm of breast: Secondary | ICD-10-CM | POA: Diagnosis not present

## 2016-04-26 DIAGNOSIS — R87612 Low grade squamous intraepithelial lesion on cytologic smear of cervix (LGSIL): Secondary | ICD-10-CM | POA: Diagnosis not present

## 2016-04-26 DIAGNOSIS — Z131 Encounter for screening for diabetes mellitus: Secondary | ICD-10-CM | POA: Diagnosis not present

## 2016-05-17 DIAGNOSIS — N760 Acute vaginitis: Secondary | ICD-10-CM | POA: Diagnosis not present

## 2016-05-17 DIAGNOSIS — N920 Excessive and frequent menstruation with regular cycle: Secondary | ICD-10-CM | POA: Diagnosis not present

## 2016-06-07 DIAGNOSIS — N920 Excessive and frequent menstruation with regular cycle: Secondary | ICD-10-CM | POA: Diagnosis not present

## 2016-06-21 DIAGNOSIS — B977 Papillomavirus as the cause of diseases classified elsewhere: Secondary | ICD-10-CM | POA: Diagnosis not present

## 2016-06-21 DIAGNOSIS — N711 Chronic inflammatory disease of uterus: Secondary | ICD-10-CM | POA: Diagnosis not present

## 2016-06-21 DIAGNOSIS — N870 Mild cervical dysplasia: Secondary | ICD-10-CM | POA: Diagnosis not present

## 2016-06-21 DIAGNOSIS — R87612 Low grade squamous intraepithelial lesion on cytologic smear of cervix (LGSIL): Secondary | ICD-10-CM | POA: Diagnosis not present

## 2016-06-21 DIAGNOSIS — Z3202 Encounter for pregnancy test, result negative: Secondary | ICD-10-CM | POA: Diagnosis not present

## 2016-06-21 DIAGNOSIS — N920 Excessive and frequent menstruation with regular cycle: Secondary | ICD-10-CM | POA: Diagnosis not present

## 2016-07-06 DIAGNOSIS — N920 Excessive and frequent menstruation with regular cycle: Secondary | ICD-10-CM | POA: Diagnosis not present

## 2016-07-06 DIAGNOSIS — Z3202 Encounter for pregnancy test, result negative: Secondary | ICD-10-CM | POA: Diagnosis not present

## 2016-07-26 DIAGNOSIS — R109 Unspecified abdominal pain: Secondary | ICD-10-CM | POA: Diagnosis not present

## 2016-08-25 ENCOUNTER — Encounter: Payer: Self-pay | Admitting: Obstetrics & Gynecology

## 2016-10-04 ENCOUNTER — Encounter: Payer: Self-pay | Admitting: Obstetrics & Gynecology

## 2016-10-04 ENCOUNTER — Ambulatory Visit (INDEPENDENT_AMBULATORY_CARE_PROVIDER_SITE_OTHER): Payer: 59 | Admitting: Obstetrics & Gynecology

## 2016-10-04 VITALS — BP 113/70 | HR 89 | Resp 16 | Ht 66.0 in | Wt 160.0 lb

## 2016-10-04 DIAGNOSIS — N938 Other specified abnormal uterine and vaginal bleeding: Secondary | ICD-10-CM

## 2016-10-04 DIAGNOSIS — N393 Stress incontinence (female) (male): Secondary | ICD-10-CM | POA: Diagnosis not present

## 2016-10-04 NOTE — Progress Notes (Signed)
   Subjective:    Patient ID: Shirley Murray, female    DOB: 04-27-1971, 46 y.o.   MRN: [id number]  HPI  46 yo MW P3 is here because she would like a TVH/BS. She has had her gyn care recently at [facility] and has been treated for her DUB with a d&c and endometrial ablation in the office. This did not cure her DUB. She has occasional pelvic pain, about twice per week, this generally relieves her pain.   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 This has been present for since her last delivery 47 years ago. She sometimes wears pads, usually just changes her panties and "goes on".   She has signed a release to get her records sent here. 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  Review of Systems     Objective:   Physical Exam [id number] Breathing, conversing, and ambulating normally Abd- benign NSSA, mobile, NT uterus, excellent pubic arch for vaginal surgery + Q tip test       Assessment & Plan:  DUB- not responding to an ablation I have offered a TVH/BS GSUI- offered mid urethral sling and cystoscopy.   She understands the risks of surgery, including, but not to infection, bleeding, DVTs, damage to bowel, bladder, ureters. She wishes to proceed. I discussed the 5% incidence of urge incontinence

## 2016-10-07 ENCOUNTER — Encounter (HOSPITAL_COMMUNITY): Payer: Self-pay

## 2016-10-07 ENCOUNTER — Encounter: Payer: Self-pay | Admitting: Obstetrics & Gynecology

## 2016-11-02 ENCOUNTER — Encounter (INDEPENDENT_AMBULATORY_CARE_PROVIDER_SITE_OTHER): Payer: Self-pay | Admitting: *Deleted

## 2016-11-06 DIAGNOSIS — H5213 Myopia, bilateral: Secondary | ICD-10-CM | POA: Diagnosis not present

## 2016-11-06 DIAGNOSIS — H524 Presbyopia: Secondary | ICD-10-CM | POA: Diagnosis not present

## 2016-11-06 DIAGNOSIS — H52223 Regular astigmatism, bilateral: Secondary | ICD-10-CM | POA: Diagnosis not present

## 2016-11-23 ENCOUNTER — Telehealth: Payer: 59 | Admitting: Family

## 2016-11-23 DIAGNOSIS — B9689 Other specified bacterial agents as the cause of diseases classified elsewhere: Secondary | ICD-10-CM

## 2016-11-23 DIAGNOSIS — J019 Acute sinusitis, unspecified: Secondary | ICD-10-CM

## 2016-11-23 MED ORDER — AMOXICILLIN-POT CLAVULANATE 875-125 MG PO TABS: 1.0000 | ORAL_TABLET | Freq: Two times a day (BID) | ORAL | 0 refills | Status: DC

## 2016-11-23 NOTE — Progress Notes (Signed)

## 2016-12-07 ENCOUNTER — Encounter: Payer: Self-pay | Admitting: *Deleted

## 2016-12-20 ENCOUNTER — Encounter (HOSPITAL_COMMUNITY): Payer: Self-pay

## 2016-12-20 ENCOUNTER — Encounter (HOSPITAL_COMMUNITY)
Admission: RE | Admit: 2016-12-20 | Discharge: 2016-12-20 | Disposition: A | Discharge status: Home / Self Care | Payer: 59 | Source: Ambulatory Visit | Attending: Obstetrics & Gynecology | Admitting: Obstetrics & Gynecology

## 2016-12-20 DIAGNOSIS — Z01812 Encounter for preprocedural laboratory examination: Secondary | ICD-10-CM | POA: Diagnosis not present

## 2016-12-20 HISTORY — DX: Personal history of urinary calculi: Z87.442

## 2016-12-20 LAB — TYPE AND SCREEN
ABO/RH(D): O POS
ANTIBODY SCREEN: NEGATIVE

## 2016-12-20 LAB — CBC
HCT: 41.7 % (ref 36.0–46.0)
Hemoglobin: 14.8 g/dL (ref 12.0–15.0)
MCH: 31.2 pg (ref 26.0–34.0)
MCHC: 35.5 g/dL (ref 30.0–36.0)
MCV: 88 fL (ref 78.0–100.0)
PLATELETS: 213 10*3/uL (ref 150–400)
RBC: 4.74 MIL/uL (ref 3.87–5.11)
RDW: 13.3 % (ref 11.5–15.5)
WBC: 8 10*3/uL (ref 4.0–10.5)

## 2016-12-20 LAB — ABO/RH: ABO/RH(D): O POS

## 2016-12-20 NOTE — Patient Instructions (Signed)
Your procedure is scheduled on:  Tuesday, December 28, 2016  Enter through the Main Entrance of [facility] at:  6:00 AM  Pick up the phone at the desk and dial [phone].  Call this number if you have problems the morning of surgery: [phone].  Remember: Do NOT eat food or drink after:  Midnight Monday  Take these medicines the morning of surgery with a SIP OF WATER:  Cetririzine, Use nasal sprays per normal routine  Stop ALL herbal medications at this time  Do NOT smoke the day of surgery.  Do NOT wear jewelry (body piercing), metal hair clips/bobby pins, make-up, artifical eyelashes or nail polish. Do NOT wear lotions, powders, or perfumes.  You may wear deodorant. Do NOT shave for 48 hours prior to surgery. Do NOT bring valuables to the hospital. Contacts, dentures, or bridgework may not be worn into surgery.  Leave suitcase in car.  After surgery it may be brought to your room.  For patients admitted to the hospital, checkout time is 11:00 AM the day of discharge.   Bring a copy of your healthcare power of attorney and living will documents.

## 2016-12-27 NOTE — Anesthesia Preprocedure Evaluation (Addendum)
Anesthesia Evaluation  Patient identified by MRN, date of birth, ID band Patient awake    Reviewed: Allergy & Precautions, H&P , Patient's Chart, lab work & pertinent test results, reviewed documented beta blocker date and time   Airway Mallampati: II  TM Distance: >3 FB Neck ROM: full    Dental no notable dental hx.    Pulmonary    Pulmonary exam normal breath sounds clear to auscultation       Cardiovascular  Rhythm:regular Rate:Normal     Neuro/Psych    GI/Hepatic   Endo/Other    Renal/GU      Musculoskeletal   Abdominal   Peds  Hematology   Anesthesia Other Findings   Reproductive/Obstetrics                             Anesthesia Physical Anesthesia Plan  ASA: II  Anesthesia Plan: Spinal   Post-op Pain Management:    Induction:   PONV Risk Score and Plan: 2 and Ondansetron, Dexamethasone, Midazolam and Propofol infusion  Airway Management Planned: Oral ETT, Mask and Simple Face Mask  Additional Equipment:   Intra-op Plan:   Post-operative Plan: Extubation in OR  Informed Consent: I have reviewed the patients History and Physical, chart, labs and discussed the procedure including the risks, benefits and alternatives for the proposed anesthesia with the patient or authorized representative who has indicated his/her understanding and acceptance.   Dental Advisory Given  Plan Discussed with: CRNA and Surgeon  Anesthesia Plan Comments: (  )       Anesthesia Quick Evaluation

## 2016-12-28 ENCOUNTER — Ambulatory Visit (HOSPITAL_COMMUNITY): Payer: 59 | Admitting: Anesthesiology

## 2016-12-28 ENCOUNTER — Encounter (HOSPITAL_COMMUNITY): Payer: Self-pay | Admitting: Certified Registered Nurse Anesthetist

## 2016-12-28 ENCOUNTER — Observation Stay (HOSPITAL_COMMUNITY)
Admission: RE | Admit: 2016-12-28 | Discharge: 2016-12-29 | Disposition: A | Discharge status: Home / Self Care | Payer: 59 | Source: Ambulatory Visit | Attending: Obstetrics & Gynecology | Admitting: Obstetrics & Gynecology

## 2016-12-28 ENCOUNTER — Encounter (HOSPITAL_COMMUNITY)
Admission: RE | Disposition: A | Discharge status: Home / Self Care | Payer: Self-pay | Source: Ambulatory Visit | Attending: Obstetrics & Gynecology

## 2016-12-28 DIAGNOSIS — G4700 Insomnia, unspecified: Secondary | ICD-10-CM | POA: Diagnosis not present

## 2016-12-28 DIAGNOSIS — N393 Stress incontinence (female) (male): Secondary | ICD-10-CM | POA: Insufficient documentation

## 2016-12-28 DIAGNOSIS — N800 Endometriosis of uterus: Secondary | ICD-10-CM | POA: Insufficient documentation

## 2016-12-28 DIAGNOSIS — N938 Other specified abnormal uterine and vaginal bleeding: Principal | ICD-10-CM | POA: Insufficient documentation

## 2016-12-28 DIAGNOSIS — Z79899 Other long term (current) drug therapy: Secondary | ICD-10-CM | POA: Diagnosis not present

## 2016-12-28 DIAGNOSIS — N870 Mild cervical dysplasia: Secondary | ICD-10-CM | POA: Insufficient documentation

## 2016-12-28 DIAGNOSIS — R102 Pelvic and perineal pain: Secondary | ICD-10-CM | POA: Diagnosis not present

## 2016-12-28 DIAGNOSIS — Z7951 Long term (current) use of inhaled steroids: Secondary | ICD-10-CM | POA: Insufficient documentation

## 2016-12-28 DIAGNOSIS — Z9889 Other specified postprocedural states: Secondary | ICD-10-CM

## 2016-12-28 HISTORY — PX: BLADDER SUSPENSION: SHX72

## 2016-12-28 HISTORY — PX: CYSTOSCOPY: SHX5120

## 2016-12-28 HISTORY — PX: VAGINAL HYSTERECTOMY: SHX2639

## 2016-12-28 LAB — PREGNANCY, URINE: PREG TEST UR: NEGATIVE

## 2016-12-28 SURGERY — HYSTERECTOMY, VAGINAL
Anesthesia: Spinal

## 2016-12-28 MED ORDER — ONDANSETRON HCL 4 MG/2ML IJ SOLN
4.0000 mg | Freq: Four times a day (QID) | INTRAMUSCULAR | Status: DC | PRN
  Administered 2016-12-28 (×2): 4 mg via INTRAVENOUS
  Filled 2016-12-28 (×2): qty 2

## 2016-12-28 MED ORDER — BUPIVACAINE-EPINEPHRINE 0.5% -1:200000 IJ SOLN
INTRAMUSCULAR | Status: DC | PRN
  Administered 2016-12-28: 60 mL

## 2016-12-28 MED ORDER — FENTANYL CITRATE (PF) 100 MCG/2ML IJ SOLN
INTRAMUSCULAR | Status: DC | PRN
  Administered 2016-12-28: 50 ug via INTRAVENOUS

## 2016-12-28 MED ORDER — LACTATED RINGERS IV SOLN
INTRAVENOUS | Status: DC
  Administered 2016-12-28 (×2): via INTRAVENOUS

## 2016-12-28 MED ORDER — STERILE WATER FOR IRRIGATION IR SOLN
Status: DC | PRN
Start: 2016-12-28 — End: 2016-12-28
  Administered 2016-12-28: 1

## 2016-12-28 MED ORDER — DEXAMETHASONE SODIUM PHOSPHATE 4 MG/ML IJ SOLN
INTRAMUSCULAR | Status: AC
  Filled 2016-12-28: qty 1

## 2016-12-28 MED ORDER — BUPIVACAINE HCL (PF) 0.5 % IJ SOLN
INTRAMUSCULAR | Status: AC
  Filled 2016-12-28: qty 30

## 2016-12-28 MED ORDER — DEXAMETHASONE SODIUM PHOSPHATE 10 MG/ML IJ SOLN
INTRAMUSCULAR | Status: DC | PRN
  Administered 2016-12-28: 4 mg via INTRAVENOUS

## 2016-12-28 MED ORDER — FLAVOXATE HCL 100 MG PO TABS
100.0000 mg | ORAL_TABLET | Freq: Three times a day (TID) | ORAL | Status: DC
  Administered 2016-12-28 – 2016-12-29 (×3): 100 mg via ORAL
  Filled 2016-12-28 (×4): qty 1

## 2016-12-28 MED ORDER — PROPOFOL 500 MG/50ML IV EMUL
INTRAVENOUS | Status: DC | PRN
  Administered 2016-12-28: 50 ug/kg/min via INTRAVENOUS

## 2016-12-28 MED ORDER — SCOPOLAMINE 1 MG/3DAYS TD PT72
1.0000 | MEDICATED_PATCH | Freq: Once | TRANSDERMAL | Status: DC
Start: 2016-12-28 — End: 2016-12-29
  Administered 2016-12-28: 1.5 mg via TRANSDERMAL

## 2016-12-28 MED ORDER — ONDANSETRON HCL 4 MG/2ML IJ SOLN
INTRAMUSCULAR | Status: AC
  Filled 2016-12-28: qty 2

## 2016-12-28 MED ORDER — FENTANYL CITRATE (PF) 100 MCG/2ML IJ SOLN
25.0000 ug | INTRAMUSCULAR | Status: DC | PRN
  Administered 2016-12-28 (×2): 50 ug via INTRAVENOUS

## 2016-12-28 MED ORDER — BUPIVACAINE-EPINEPHRINE (PF) 0.5% -1:200000 IJ SOLN
INTRAMUSCULAR | Status: AC
  Filled 2016-12-28: qty 30

## 2016-12-28 MED ORDER — SUGAMMADEX SODIUM 200 MG/2ML IV SOLN
INTRAVENOUS | Status: AC
  Filled 2016-12-28: qty 2

## 2016-12-28 MED ORDER — PROPOFOL 500 MG/50ML IV EMUL
INTRAVENOUS | Status: AC
  Filled 2016-12-28: qty 50

## 2016-12-28 MED ORDER — ZOLPIDEM TARTRATE 5 MG PO TABS
5.0000 mg | ORAL_TABLET | Freq: Once | ORAL | Status: AC
  Administered 2016-12-28: 5 mg via ORAL
  Filled 2016-12-28: qty 1

## 2016-12-28 MED ORDER — MORPHINE SULFATE 15 MG PO TABS
15.0000 mg | ORAL_TABLET | ORAL | Status: DC | PRN
  Administered 2016-12-28: 15 mg via ORAL
  Filled 2016-12-28: qty 1

## 2016-12-28 MED ORDER — ONDANSETRON HCL 4 MG/2ML IJ SOLN
INTRAMUSCULAR | Status: DC | PRN
  Administered 2016-12-28: 4 mg via INTRAVENOUS

## 2016-12-28 MED ORDER — FENTANYL CITRATE (PF) 250 MCG/5ML IJ SOLN
INTRAMUSCULAR | Status: AC
  Filled 2016-12-28: qty 5

## 2016-12-28 MED ORDER — PHENYLEPHRINE HCL 10 MG/ML IJ SOLN
INTRAMUSCULAR | Status: DC | PRN
  Administered 2016-12-28: .04 mg via INTRAVENOUS

## 2016-12-28 MED ORDER — ACETAMINOPHEN 325 MG PO TABS
650.0000 mg | ORAL_TABLET | ORAL | Status: DC | PRN
  Administered 2016-12-28 – 2016-12-29 (×3): 650 mg via ORAL
  Filled 2016-12-28 (×3): qty 2

## 2016-12-28 MED ORDER — LACTATED RINGERS IV SOLN
INTRAVENOUS | Status: DC
  Administered 2016-12-28: 22:00:00 via INTRAVENOUS

## 2016-12-28 MED ORDER — SODIUM CHLORIDE 0.9 % IJ SOLN
INTRAMUSCULAR | Status: AC
  Filled 2016-12-28: qty 50

## 2016-12-28 MED ORDER — DIPHENHYDRAMINE HCL 25 MG PO CAPS
25.0000 mg | ORAL_CAPSULE | ORAL | Status: DC | PRN
  Administered 2016-12-28: 25 mg via ORAL
  Filled 2016-12-28: qty 1

## 2016-12-28 MED ORDER — TRAMADOL HCL 50 MG PO TABS
50.0000 mg | ORAL_TABLET | Freq: Four times a day (QID) | ORAL | Status: DC | PRN
  Administered 2016-12-28 – 2016-12-29 (×3): 50 mg via ORAL
  Filled 2016-12-28 (×3): qty 1

## 2016-12-28 MED ORDER — CEFAZOLIN SODIUM-DEXTROSE 2-4 GM/100ML-% IV SOLN
2.0000 g | INTRAVENOUS | Status: AC
  Administered 2016-12-28: 2 g via INTRAVENOUS

## 2016-12-28 MED ORDER — ONDANSETRON HCL 4 MG PO TABS: 4.0000 mg | ORAL_TABLET | Freq: Four times a day (QID) | ORAL | Status: DC | PRN

## 2016-12-28 MED ORDER — SODIUM CHLORIDE 0.9 % IJ SOLN
INTRAMUSCULAR | Status: DC | PRN
  Administered 2016-12-28: 100 mL via INTRAVENOUS

## 2016-12-28 MED ORDER — HYDROMORPHONE HCL 1 MG/ML IJ SOLN: 0.2500 mg | INTRAMUSCULAR | Status: DC | PRN

## 2016-12-28 MED ORDER — SCOPOLAMINE 1 MG/3DAYS TD PT72
MEDICATED_PATCH | TRANSDERMAL | Status: AC
  Administered 2016-12-28: 1.5 mg via TRANSDERMAL
  Filled 2016-12-28: qty 1

## 2016-12-28 MED ORDER — MIDAZOLAM HCL 2 MG/2ML IJ SOLN
INTRAMUSCULAR | Status: AC
  Filled 2016-12-28: qty 2

## 2016-12-28 MED ORDER — LIDOCAINE HCL (PF) 1 % IJ SOLN
INTRAMUSCULAR | Status: AC
  Filled 2016-12-28: qty 5

## 2016-12-28 MED ORDER — KETOROLAC TROMETHAMINE 30 MG/ML IJ SOLN
INTRAMUSCULAR | Status: AC
  Filled 2016-12-28: qty 1

## 2016-12-28 MED ORDER — LIDOCAINE HCL 1 % IJ SOLN
INTRAMUSCULAR | Status: AC
  Filled 2016-12-28: qty 10

## 2016-12-28 MED ORDER — IBUPROFEN 800 MG PO TABS
800.0000 mg | ORAL_TABLET | Freq: Three times a day (TID) | ORAL | Status: DC | PRN
  Administered 2016-12-28 – 2016-12-29 (×2): 800 mg via ORAL
  Filled 2016-12-28 (×2): qty 1

## 2016-12-28 MED ORDER — PHENYLEPHRINE 40 MCG/ML (10ML) SYRINGE FOR IV PUSH (FOR BLOOD PRESSURE SUPPORT)
PREFILLED_SYRINGE | INTRAVENOUS | Status: AC
  Filled 2016-12-28: qty 10

## 2016-12-28 MED ORDER — PROPOFOL 10 MG/ML IV BOLUS
INTRAVENOUS | Status: AC
  Filled 2016-12-28: qty 20

## 2016-12-28 MED ORDER — CIPROFLOXACIN IN D5W 400 MG/200ML IV SOLN
400.0000 mg | Freq: Once | INTRAVENOUS | Status: AC
  Administered 2016-12-28: 400 mg via INTRAVENOUS
  Filled 2016-12-28: qty 200

## 2016-12-28 MED ORDER — LIDOCAINE HCL (CARDIAC) 20 MG/ML IV SOLN
INTRAVENOUS | Status: DC | PRN
  Administered 2016-12-28: 50 mg via INTRAVENOUS

## 2016-12-28 MED ORDER — FENTANYL CITRATE (PF) 100 MCG/2ML IJ SOLN
INTRAMUSCULAR | Status: AC
  Administered 2016-12-28: 50 ug via INTRAVENOUS
  Filled 2016-12-28: qty 2

## 2016-12-28 MED ORDER — MIDAZOLAM HCL 2 MG/2ML IJ SOLN
INTRAMUSCULAR | Status: DC | PRN
  Administered 2016-12-28 (×2): 1 mg via INTRAVENOUS

## 2016-12-28 SURGICAL SUPPLY — 40 items
BLADE SURG 10 STRL SS (BLADE) ×4 IMPLANT
BLADE SURG 11 STRL SS (BLADE) ×4 IMPLANT
BLADE SURG 15 STRL LF C SS BP (BLADE) ×2 IMPLANT
BLADE SURG 15 STRL SS (BLADE) ×2
CANISTER SUCT 3000ML PPV (MISCELLANEOUS) ×4 IMPLANT
CATH FOLEY 2WAY SLVR  5CC 18FR (CATHETERS) ×2
CATH FOLEY 2WAY SLVR 5CC 18FR (CATHETERS) ×2 IMPLANT
CLOTH BEACON ORANGE TIMEOUT ST (SAFETY) ×4 IMPLANT
CONT PATH 16OZ SNAP LID 3702 (MISCELLANEOUS) ×4 IMPLANT
DECANTER SPIKE VIAL GLASS SM (MISCELLANEOUS) ×4 IMPLANT
DERMABOND ADHESIVE PROPEN (GAUZE/BANDAGES/DRESSINGS) ×2
DERMABOND ADVANCED .7 DNX6 (GAUZE/BANDAGES/DRESSINGS) ×2 IMPLANT
GAUZE SPONGE 4X4 16PLY XRAY LF (GAUZE/BANDAGES/DRESSINGS) ×4 IMPLANT
GLOVE BIO SURGEON STRL SZ 6.5 (GLOVE) ×3 IMPLANT
GLOVE BIO SURGEONS STRL SZ 6.5 (GLOVE) ×1
GLOVE BIOGEL PI IND STRL 6.5 (GLOVE) ×2 IMPLANT
GLOVE BIOGEL PI IND STRL 7.0 (GLOVE) ×6 IMPLANT
GLOVE BIOGEL PI INDICATOR 6.5 (GLOVE) ×2
GLOVE BIOGEL PI INDICATOR 7.0 (GLOVE) ×6
GOWN STRL REUS W/TWL LRG LVL3 (GOWN DISPOSABLE) ×16 IMPLANT
HEMOSTAT SURGICEL 2X3 (HEMOSTASIS) IMPLANT
NEEDLE MAYO CATGUT SZ4 (NEEDLE) ×4 IMPLANT
NEEDLE SPNL 18GX3.5 QUINCKE PK (NEEDLE) ×4 IMPLANT
NS IRRIG 1000ML POUR BTL (IV SOLUTION) ×4 IMPLANT
PACK TRENDGUARD 450 HYBRID PRO (MISCELLANEOUS) ×2 IMPLANT
PACK VAGINAL WOMENS (CUSTOM PROCEDURE TRAY) ×4 IMPLANT
PAD OB MATERNITY 4.3X12.25 (PERSONAL CARE ITEMS) ×4 IMPLANT
SET CYSTO W/LG BORE CLAMP LF (SET/KITS/TRAYS/PACK) ×4 IMPLANT
SLING TVT EXACT (Sling) ×4 IMPLANT
SUT VIC AB 0 CT1 36 (SUTURE) ×4 IMPLANT
SUT VIC AB 2-0 CT1 18 (SUTURE) ×8 IMPLANT
SUT VIC AB 2-0 CT1 27 (SUTURE) ×4
SUT VIC AB 2-0 CT1 TAPERPNT 27 (SUTURE) ×4 IMPLANT
SUT VIC AB 2-0 SH 27 (SUTURE) ×2
SUT VIC AB 2-0 SH 27XBRD (SUTURE) ×2 IMPLANT
SUT VIC AB 4-0 SH 27 (SUTURE) ×4
SUT VIC AB 4-0 SH 27XANBCTRL (SUTURE) ×4 IMPLANT
TOWEL OR 17X24 6PK STRL BLUE (TOWEL DISPOSABLE) ×8 IMPLANT
TRAY FOLEY CATH SILVER 14FR (SET/KITS/TRAYS/PACK) ×4 IMPLANT
TRENDGUARD 450 HYBRID PRO PACK (MISCELLANEOUS) ×4

## 2016-12-28 NOTE — Anesthesia Postprocedure Evaluation (Signed)
Anesthesia Post Note  Patient: TEDDI BADALAMENTI  Procedure(s) Performed: Procedure(s) (LRB): HYSTERECTOMY VAGINAL WITH BILATERAL SALPINGECTOMY (Bilateral) CYSTOSCOPY (N/A) TRANSVAGINAL TAPE (TVT) PROCEDURE (N/A)     Patient location during evaluation: PACU Anesthesia Type: Spinal Level of consciousness: awake Pain management: satisfactory to patient Vital Signs Assessment: post-procedure vital signs reviewed and stable Respiratory status: spontaneous breathing Cardiovascular status: blood pressure returned to baseline Postop Assessment: no headache and spinal receding Anesthetic complications: no    Last Vitals:  Vitals:   12/28/16 1030 12/28/16 1133  BP: 125/66 108/75  Pulse: 60 62  Resp: 18 18  Temp: 36.6 C (!) 36.3 C    Last Pain:  Vitals:   12/28/16 1350  TempSrc:   PainSc: 5    Pain Goal: Patients Stated Pain Goal: 3 (12/28/16 1216)               Denisa Enterline EDWARD

## 2016-12-28 NOTE — Progress Notes (Signed)
Dr Hulan Fray phoned in - updated. No new orders rec'd.

## 2016-12-28 NOTE — Progress Notes (Signed)
Pt requesting Ultram for pain - said she couldn't remember the name of the drug when Dr Hulan Fray came to see her earlier. Unable to get in touch with Dr Hulan Fray. Spoke with Dr Nehemiah Settle for other possible contact #s. MD asked about pt - informed of specific surgery today & pt with multiple allergies. Rec'd order from MD for Ultram.
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
## 2016-12-28 NOTE — Op Note (Signed)
12/28/2016  9:02 AM  PATIENT:  Shirley Murray  46 y.o. female  PRE-OPERATIVE DIAGNOSIS:  DUB PELVIC PAIN GSUI  POST-OPERATIVE DIAGNOSIS:  DUB PELVIC PAIN STRESS INCONTINENCE  PROCEDURE:  Procedure(s): HYSTERECTOMY VAGINAL WITH BILATERAL SALPINGECTOMY (Bilateral) CYSTOSCOPY (N/A) TRANSVAGINAL TAPE (TVT) PROCEDURE (N/A)  SURGEON:  Surgeon(s) and Role:    * Oday Ridings, Wilhemina Cash, MD - Primary    * Aletha Halim, MD - Assisting  PHYSICIAN ASSISTANT:   ASSISTANTS: Milana Kidney, MS3   ANESTHESIA:   local and spinal  EBL:  Total I/O In: 1500 [I.V.:1500] Out: 550 [Urine:450; Blood:100]  BLOOD ADMINISTERED:none  DRAINS: none   LOCAL MEDICATIONS USED:  MARCAINE     SPECIMEN:  Source of Specimen:  uterus and tubes  DISPOSITION OF SPECIMEN:  PATHOLOGY  COUNTS:  YES  TOURNIQUET:  * No tourniquets in log *  DICTATION: .Dragon Dictation  PLAN OF CARE: Admit for overnight observation  PATIENT DISPOSITION:  PACU - hemodynamically stable.   Delay start of Pharmacological VTE agent (>24hrs) due to surgical blood loss or risk of bleeding: not applicable  The risks, benefits, alternatives of surgery were explained, understood, and accepted. All questions were answered and a consent form was signed. She was taken to the operating room and spinal anesthesia was applied. She was put in the dorsal lithotomy position. Her vagina was prepped and draped in the usual sterile fashion. A timeout procedure was done. Adequate anesthesia was assured.  A Foley catheter was placed and it drained clear urine throughout the case. The cervix was grasped with a single-tooth tenaculum. A total of 40 cc of dilute Marcaine was injected in a circumferential fashion at the cervicovaginal junction. An incision was made at the site. The posterior peritoneum was entered. A long weighted speculum was placed. The anterior peritoneum was entered. A Deaver was placed anteriorly. The uterosacral ligaments were  clamped, cut, and ligated. They were tagged and held. 2 Vicryl sutures used throughout this case unless otherwise specified. The luterus was separated from its pelvic attachments using a similar clamp, cut, ligate technique.  Once the uterus was removed, the bowel was kept out of the operative site with a sponge on a stick. I was able to visualize each adnexa and grabbed each oviduct  with a Babcock clamp. Using a Kelly clamp, I was able to clamp, cut, and ligate the fimbriated end of each oviduct. I then assured hemostasis of all pedicles. I closed the peritoneum in a purse-string fashion. I used the tagged and held sutures of the uterosacral ligaments to incorporate them into the vaginal cuff angles. I then closed the vaginal cuff in a verticle, running, locking fashion. I infiltrated the spaces about 2 cm lateral to the midline behind the symphysis pubis on each side with a total of 20 cc of 0.5% marcaien. I then infiltrated the vaginal mucosa with 0.5% marcaine. I used a total of 30 mL of this anesthetic during the whole case. I made a 1 cm incision just below my Allis clamp, and used Metzenbaum scissors to dissect  each side of the urethral meatus. Please note that I had a urethral stylet in place, deviating the bladder out of the operative site. I then placed the TVT Exact mid urethral sling to the patient's right and then to the left. I performed cystoscopy and noted the TVT device was visible on her left. I removed it and replaced it. I then repeated the cystoscopy. The device was not visible. I drained the bladder  and then elevated the sling, making sure there was no tension on the sling. I placed a Kelly clamps between the sling and the patient's urethra to prevent overtightening. The plastic sheaths were removed, and the sling was cut at the skin edges. The skin edges were closed with Dermabond. The vaginal mucosal incision was closed with a 4-0 Vicryl running locking suture. Excellent hemostasis was  noted. She was extubated and taken to the recovery room in excellent condition. The instrument sponge and needle counts were correct.She was extubated and taken to the recovery room in stable condition.

## 2016-12-28 NOTE — Transfer of Care (Signed)
Immediate Anesthesia Transfer of Care Note  Patient: Shirley Murray  Procedure(s) Performed: Procedure(s): HYSTERECTOMY VAGINAL WITH BILATERAL SALPINGECTOMY (Bilateral) CYSTOSCOPY (N/A) TRANSVAGINAL TAPE (TVT) PROCEDURE (N/A)  Patient Location: PACU  Anesthesia Type:Regional and Spinal  Level of Consciousness: awake, alert  and oriented  Airway & Oxygen Therapy: Patient Spontanous Breathing and Patient connected to nasal cannula oxygen  Post-op Assessment: Report given to RN and Post -op Vital signs reviewed and stable  Post vital signs: Reviewed and stable  Last Vitals:  Vitals:   12/28/16 0945 12/28/16 1000  BP: (!) 131/57 120/75  Pulse: 81 67  Resp: 16 13  Temp:      Last Pain:  Vitals:   12/28/16 1000  TempSrc:   PainSc: 1       Patients Stated Pain Goal: 4 (50/53/97 6734)  Complications: No apparent anesthesia complications

## 2016-12-28 NOTE — Anesthesia Procedure Notes (Signed)
Spinal  Patient location during procedure: OR Staffing Anesthesiologist: Lyndle Herrlich Preanesthetic Checklist Completed: patient identified, site marked, surgical consent, pre-op evaluation, timeout performed, IV checked, risks and benefits discussed and monitors and equipment checked Spinal Block Patient position: sitting Prep: DuraPrep Patient monitoring: heart rate, cardiac monitor, continuous pulse ox and blood pressure Approach: midline Location: L3-4 Injection technique: single-shot Needle Needle type: Sprotte  Needle gauge: 24 G Needle length: 9 cm Assessment Sensory level: T6 Additional Notes Spinal Dosage in OR  .75% Bupivicaine ml       1.5

## 2016-12-29 ENCOUNTER — Encounter (HOSPITAL_COMMUNITY): Payer: Self-pay | Admitting: Obstetrics & Gynecology

## 2016-12-29 DIAGNOSIS — Z79899 Other long term (current) drug therapy: Secondary | ICD-10-CM | POA: Diagnosis not present

## 2016-12-29 DIAGNOSIS — N393 Stress incontinence (female) (male): Secondary | ICD-10-CM | POA: Diagnosis not present

## 2016-12-29 DIAGNOSIS — N870 Mild cervical dysplasia: Secondary | ICD-10-CM | POA: Diagnosis not present

## 2016-12-29 DIAGNOSIS — N938 Other specified abnormal uterine and vaginal bleeding: Secondary | ICD-10-CM | POA: Diagnosis not present

## 2016-12-29 DIAGNOSIS — R102 Pelvic and perineal pain: Secondary | ICD-10-CM | POA: Diagnosis not present

## 2016-12-29 DIAGNOSIS — Z7951 Long term (current) use of inhaled steroids: Secondary | ICD-10-CM | POA: Diagnosis not present

## 2016-12-29 DIAGNOSIS — N800 Endometriosis of uterus: Secondary | ICD-10-CM | POA: Diagnosis not present

## 2016-12-29 LAB — CBC
HEMATOCRIT: 38.7 % (ref 36.0–46.0)
HEMOGLOBIN: 13.4 g/dL (ref 12.0–15.0)
MCH: 30.6 pg (ref 26.0–34.0)
MCHC: 34.6 g/dL (ref 30.0–36.0)
MCV: 88.4 fL (ref 78.0–100.0)
Platelets: 211 10*3/uL (ref 150–400)
RBC: 4.38 MIL/uL (ref 3.87–5.11)
RDW: 13.2 % (ref 11.5–15.5)
WBC: 10.6 10*3/uL — ABNORMAL HIGH (ref 4.0–10.5)

## 2016-12-29 MED ORDER — FLAVOXATE HCL 100 MG PO TABS: 100.0000 mg | ORAL_TABLET | Freq: Three times a day (TID) | ORAL | 0 refills | Status: DC

## 2016-12-29 MED ORDER — IBUPROFEN 600 MG PO TABS: 600.0000 mg | ORAL_TABLET | Freq: Four times a day (QID) | ORAL | 1 refills | Status: DC | PRN

## 2016-12-29 MED ORDER — MORPHINE SULFATE 15 MG PO TABS: 15.0000 mg | ORAL_TABLET | ORAL | 0 refills | Status: DC | PRN

## 2016-12-29 NOTE — Discharge Summary (Signed)
Physician Discharge Summary  Patient ID: Shirley Murray MRN: [id number] DOB/AGE: 1970-11-19 46 y.o.  Admit date: 12/28/2016 Discharge date: 12/29/2016  Admission Diagnoses: DUB, Chronic pelvic pain  Discharge Diagnoses: same Active Problems:   Post-operative state   Discharged Condition: good  Hospital Course: She underwent a TVH/BS/midurethral sling/cystoscopy. There was an inadvertant bladder perforation. She will go home with a Foley and will remove it Friday. By POD #1 she was tolerating po well, ambulating, and having flatus. She expressed her readiness to go home at 0700 on POD #1.  Consults: None  Significant Diagnostic Studies: labs: Her post op hbg was 1 gram less than her pre op hbg.  Treatments: surgery: as above  Discharge Exam: Blood pressure (!) 103/48, pulse 65, temperature 98.4 F (36.9 C), temperature source Oral, resp. rate 16, height 5' 5.75" (1.67 m), weight 73.1 kg (161 lb 2 oz), last menstrual period 12/19/2016, SpO2 95 %. General appearance: alert Resp: clear to auscultation bilaterally Cardio: regular rate and rhythm, S1, S2 normal, no murmur, click, rub or gallop GI: soft, non-tender; bowel sounds normal; no masses,  no organomegaly  Disposition: 01-Home or Self Care   Allergies as of 12/29/2016      Reactions   Codeine Rash   Maxalt [rizatriptan] Hives, Swelling   Augmentin [amoxicillin-pot Clavulanate] Itching, Rash   Dilaudid [hydromorphone] Itching, Rash   Imitrex [sumatriptan] Rash   Mucinex [guaifenesin Er] Rash   Relpax [eletriptan] Rash      Medication List    TAKE these medications   cetirizine 10 MG tablet Commonly known as:  ZYRTEC Take 10 mg by mouth daily.   diphenhydrAMINE 25 mg capsule Commonly known as:  BENADRYL Take 25 mg by mouth at bedtime as needed for sleep.   flavoxATE 100 MG tablet Commonly known as:  URISPAS Take 1 tablet (100 mg total) by mouth every 8 (eight) hours.   fluticasone 50 MCG/ACT nasal  spray Commonly known as:  FLONASE Place 2 sprays into both nostrils daily.   ibuprofen 600 MG tablet Commonly known as:  ADVIL,MOTRIN Take 1 tablet (600 mg total) by mouth every 6 (six) hours as needed. What changed:  medication strength  how much to take  when to take this  reasons to take this   morphine 15 MG tablet Commonly known as:  MSIR Take 1 tablet (15 mg total) by mouth every 4 (four) hours as needed for severe pain.   ondansetron 4 MG tablet Commonly known as:  ZOFRAN Take 1 tablet (4 mg total) by mouth every 8 (eight) hours as needed for nausea or vomiting.   promethazine 25 MG tablet Commonly known as:  PHENERGAN Take 1 tablet (25 mg total) by mouth every 6 (six) hours as needed.      Follow-up Information    Emily Filbert, MD Follow up.   Specialty:  Obstetrics and Gynecology Why:  She has a follow up appt 9--11-18 Contact information: [address] [place] [address] [phone]           Signed: Emily Filbert 12/29/2016, 7:08 AM

## 2016-12-29 NOTE — Discharge Instructions (Signed)
Abdominal Hysterectomy, Care After °This sheet gives you information about how to care for yourself after your procedure. Your doctor may also give you more specific instructions. If you have problems or questions, contact your doctor. °Follow these instructions at home: °Bathing °· Do not take baths, swim, or use a hot tub until your doctor says it is okay. Ask your doctor if you can take showers. You may only be allowed to take sponge baths for bathing. °· Keep the bandage (dressing) dry until your doctor says it can be taken off. °Surgical cut ( °incision) care °· Follow instructions from your doctor about how to take care of your cut from surgery. Make sure you: °? Wash your hands with soap and water before you change your bandage (dressing). If you cannot use soap and water, use hand sanitizer. °? Change your bandage as told by your doctor. °? Leave stitches (sutures), skin glue, or skin tape (adhesive) strips in place. They may need to stay in place for 2 weeks or longer. If tape strips get loose and curl up, you may trim the loose edges. Do not remove tape strips completely unless your doctor says it is okay. °· Check your surgical cut area every day for signs of infection. Check for: °? Redness, swelling, or pain. °? Fluid or blood. °? Warmth. °? Pus or a bad smell. °Activity °· Do gentle, daily exercise as told by your doctor. You may be told to take short walks every day and go farther each time. °· Do not lift anything that is heavier than 10 lb (4.5 kg), or the limit that your doctor tells you, until he or she says that it is safe. °· Do not drive or use heavy machinery while taking prescription pain medicine. °· Do not drive for 24 hours if you were given a medicine to help you relax (sedative). °· Follow your doctor's advice about exercise, driving, and general activities. Ask your doctor what activities are safe for you. °Lifestyle °· Do not douche, use tampons, or have sex for at least 6 weeks or as  told by your doctor. °· Do not drink alcohol until your doctor says it is okay. °· Drink enough fluid to keep your pee (urine) clear or pale yellow. °· Try to have someone at home with you for the first 1-2 weeks to help. °· Do not use any products that contain nicotine or tobacco, such as cigarettes and e-cigarettes. These can slow down healing. If you need help quitting, ask your doctor. °General instructions °· Take over-the-counter and prescription medicines only as told by your doctor. °· Do not take aspirin or ibuprofen. These medicines can cause bleeding. °· To prevent or treat constipation while you are taking prescription pain medicine, your doctor may suggest that you: °? Drink enough fluid to keep your urine clear or pale yellow. °? Take over-the-counter or prescription medicines. °? Eat foods that are high in fiber, such as: °§ Fresh fruits and vegetables. °§ Whole grains. °§ Beans. °? Limit foods that are high in fat and processed sugars, such as fried and sweet foods. °· Keep all follow-up visits as told by your doctor. This is important. °Contact a doctor if: °· You have chills or fever. °· You have redness, swelling, or pain around your cut. °· You have fluid or blood coming from your cut. °· Your cut feels warm to the touch. °· You have pus or a bad smell coming from your cut. °· Your cut breaks   open. °· You feel dizzy or light-headed. °· You have pain or bleeding when you pee. °· You keep having watery poop (diarrhea). °· You keep feeling sick to your stomach (nauseous) or keep throwing up (vomiting). °· You have unusual fluid (discharge) coming from your vagina. °· You have a rash. °· You have a reaction to your medicine. °· Your pain medicine does not help. °Get help right away if: °· You have a fever and your symptoms get worse all of a sudden. °· You have very bad belly (abdominal) pain. °· You are short of breath. °· You pass out (faint). °· You have pain, swelling, or redness of your  leg. °· You bleed a lot from your vagina and notice clumps of blood (clots). °Summary °· Do not take baths, swim, or use a hot tub until your doctor says it is okay. Ask your doctor if you can take showers. You may only be allowed to take sponge baths for bathing. °· Follow your doctor's advice about exercise, driving, and general activities. Ask your doctor what activities are safe for you. °· Do not lift anything that is heavier than 10 lb (4.5 kg), or the limit that your doctor tells you, until he or she says that it is safe. °· Try to have someone at home with you for the first 1-2 weeks to help. °This information is not intended to replace advice given to you by your health care provider. Make sure you discuss any questions you have with your health care provider. °Document Released: 02/24/2008 Document Revised: 05/05/2016 Document Reviewed: 05/05/2016 °Elsevier Interactive Patient Education © 2017 [employer] ° °

## 2017-01-03 ENCOUNTER — Inpatient Hospital Stay (HOSPITAL_COMMUNITY)
Admission: AD | Admit: 2017-01-03 | Discharge: 2017-01-03 | Disposition: A | Discharge status: Home / Self Care | Payer: 59 | Source: Ambulatory Visit | Attending: Obstetrics & Gynecology | Admitting: Obstetrics & Gynecology

## 2017-01-03 DIAGNOSIS — N9989 Other postprocedural complications and disorders of genitourinary system: Secondary | ICD-10-CM | POA: Diagnosis not present

## 2017-01-03 DIAGNOSIS — R338 Other retention of urine: Secondary | ICD-10-CM

## 2017-01-03 DIAGNOSIS — Z880 Allergy status to penicillin: Secondary | ICD-10-CM | POA: Insufficient documentation

## 2017-01-03 DIAGNOSIS — R339 Retention of urine, unspecified: Secondary | ICD-10-CM | POA: Insufficient documentation

## 2017-01-03 LAB — URINALYSIS, ROUTINE W REFLEX MICROSCOPIC
BILIRUBIN URINE: NEGATIVE
Glucose, UA: NEGATIVE mg/dL
Hgb urine dipstick: NEGATIVE
Ketones, ur: NEGATIVE mg/dL
Leukocytes, UA: NEGATIVE
NITRITE: NEGATIVE
Protein, ur: NEGATIVE mg/dL
SPECIFIC GRAVITY, URINE: 1.011 (ref 1.005–1.030)
pH: 5 (ref 5.0–8.0)
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
## 2017-01-03 NOTE — Progress Notes (Signed)
Patient verbalized understanding of dc instructions. Printed AVS given.  Pt made f/u appointment for Wednesday at [facility] with Dr.Dove.  Patient has leg bag for foley at home. States understanding of care and management of foley.  DCd home in good condition.  NO pain.

## 2017-01-03 NOTE — Discharge Instructions (Signed)
Indwelling Urinary Catheter Insertion  For people with certain conditions, urine is not able to move normally through the part of the body that drains urine from the bladder (urethra). An indwelling urinary catheter is a thin, sterile tube (catheter) that is placed (inserted) into the bladder through the urethra to help drain urine out of the body. After the catheter is inserted, it is held in place by a small balloon on the catheter that is filled with sterile water. Urine drains from the catheter into a drainage bag outside of the body. An indwelling urinary catheter may be needed if you have urinary retention problems or bladder obstruction. It may also be needed during and after surgical procedures and for other medical conditions. Tell a health care provider about:  Any allergies you have.  Any surgeries you have had.  Any medical conditions you have.  Any blood disorders you have. What are the risks? Generally, this is a safe procedure. However, problems may occur, including:  Infection.  Bleeding.  Damage to other structures or organs.  What happens before the procedure?  Your health care provider will inspect your urethra before inserting the catheter. What happens during the procedure?  To lower your risk of infection: ? Your health care team will wash or sanitize their hands. ? Your skin will be washed with soap.  A lubricant will be placed on the catheter to ease the insertion of it into the urethra.  The catheter will be inserted into the urethra until you can see urine in the drainage bag. After the urine starts to flow, the catheter may be inserted another couple of inches (about 5 cm).  Sterile water will be used to inflate the balloon to hold the catheter in place.  After the catheter balloon is inflated, it will be pulled back so it is against the narrow opening at the end of the bladder.  Your health care provider will check for urine flow into the drainage  bag. The procedure may vary among health care providers and hospitals. What happens after the procedure?  Urine in the drainage bag will be emptied and measured by your health care provider while you are in the hospital.  Your health care provider will remove the catheter for you. This will be done when the catheter is no longer necessary, which is likely to be before you leave the hospital. Summary  An indwelling catheter is a sterile tube that is placed into the bladder through the urethra to help drain urine out of the body.  The catheter will be removed as soon as it is no longer necessary. This information is not intended to replace advice given to you by your health care provider. Make sure you discuss any questions you have with your health care provider. Document Released: 06/26/2016 Document Revised: 06/26/2016 Document Reviewed: 06/26/2016 Elsevier Interactive Patient Education  2018 [employer].   Acute Urinary Retention, Female Urinary retention means you are unable to pee completely or at all (empty your bladder). Follow these instructions at home:  Drink enough fluids to keep your pee (urine) clear or pale yellow.  If you are sent home with a tube that drains the bladder (catheter), there will be a drainage bag attached to it. There are two types of bags. One is big that you can wear at night without having to empty it. One is smaller and needs to be emptied more often. ? Keep the drainage bag emptied. ? Keep the drainage bag lower than  the tube.  Only take medicine as told by your doctor. Contact a doctor if:  You have a low-grade fever.  You have spasms or you are leaking pee when you have spasms. Get help right away if:  You have chills or a fever.  Your catheter stops draining pee.  Your catheter falls out.  You have increased bleeding that does not stop after you have rested and increased the amount of fluids you had been drinking. This information is  not intended to replace advice given to you by your health care provider. Make sure you discuss any questions you have with your health care provider. Document Released: 11/03/2007 Document Revised: 10/23/2015 Document Reviewed: 10/26/2012 Elsevier Interactive Patient Education  2017 [employer].

## 2017-01-05 ENCOUNTER — Telehealth: Payer: Self-pay | Admitting: *Deleted

## 2017-01-05 ENCOUNTER — Encounter: Payer: Self-pay | Admitting: Obstetrics & Gynecology

## 2017-01-05 ENCOUNTER — Ambulatory Visit: Payer: 59 | Admitting: Obstetrics & Gynecology

## 2017-01-05 DIAGNOSIS — R197 Diarrhea, unspecified: Secondary | ICD-10-CM

## 2017-01-05 MED ORDER — DIPHENOXYLATE-ATROPINE 2.5-0.025 MG PO TABS: 1.0000 | ORAL_TABLET | Freq: Four times a day (QID) | ORAL | 0 refills | Status: DC | PRN

## 2017-01-05 NOTE — Telephone Encounter (Signed)
Pt having diarrhea and is requesting Lomotil to help.  Spoke with Dr Gala Romney who [place] it to be sent to her pharmacy.

## 2017-02-08 ENCOUNTER — Encounter: Payer: Self-pay | Admitting: Obstetrics and Gynecology

## 2017-02-08 ENCOUNTER — Ambulatory Visit (INDEPENDENT_AMBULATORY_CARE_PROVIDER_SITE_OTHER): Payer: 59 | Admitting: Obstetrics and Gynecology

## 2017-02-08 VITALS — BP 112/70 | HR 88 | Wt 151.0 lb

## 2017-02-08 DIAGNOSIS — Z9889 Other specified postprocedural states: Secondary | ICD-10-CM
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
## 2017-03-15 ENCOUNTER — Other Ambulatory Visit (HOSPITAL_COMMUNITY): Payer: Self-pay | Admitting: Obstetrics & Gynecology

## 2017-03-15 DIAGNOSIS — Z1231 Encounter for screening mammogram for malignant neoplasm of breast: Secondary | ICD-10-CM

## 2017-05-05 ENCOUNTER — Encounter: Payer: Self-pay | Admitting: Family Medicine

## 2017-05-05 ENCOUNTER — Other Ambulatory Visit: Payer: Self-pay | Admitting: Nurse Practitioner

## 2017-05-05 MED ORDER — TOPIRAMATE 200 MG PO TABS: ORAL_TABLET | ORAL | 1 refills | Status: DC

## 2017-05-05 MED ORDER — CITALOPRAM HYDROBROMIDE 20 MG PO TABS: 20.0000 mg | ORAL_TABLET | Freq: Every day | ORAL | 1 refills | Status: DC

## 2017-05-12 ENCOUNTER — Ambulatory Visit: Payer: 59

## 2017-05-18 ENCOUNTER — Ambulatory Visit (INDEPENDENT_AMBULATORY_CARE_PROVIDER_SITE_OTHER): Payer: 59 | Admitting: Nurse Practitioner

## 2017-05-18 ENCOUNTER — Encounter: Payer: Self-pay | Admitting: Nurse Practitioner

## 2017-05-18 VITALS — BP 124/62 | HR 80 | Ht 65.0 in | Wt 149.0 lb

## 2017-05-18 DIAGNOSIS — F419 Anxiety disorder, unspecified: Secondary | ICD-10-CM | POA: Diagnosis not present

## 2017-05-18 DIAGNOSIS — G4700 Insomnia, unspecified: Secondary | ICD-10-CM | POA: Diagnosis not present

## 2017-05-18 DIAGNOSIS — G43009 Migraine without aura, not intractable, without status migrainosus: Secondary | ICD-10-CM | POA: Diagnosis not present

## 2017-05-18 MED ORDER — CLONAZEPAM 0.5 MG PO TABS: 0.5000 mg | ORAL_TABLET | Freq: Every day | ORAL | 2 refills | Status: DC

## 2017-05-18 NOTE — Progress Notes (Signed)
Subjective: Presents for recheck on her anxiety.  Recently found out that her husband was cheating on her.  Since then has had increased anxiety and insomnia.  Had a hysterectomy in July.  Had some complications with her bladder.  Staying very active with regular exercise.  Seeing her pastor for counseling.  Was off her Topamax but recently restarted because of recurrence of her headaches with the stress. Depression screen PHQ 2/9 05/18/2017  Decreased Interest 0  Down, Depressed, Hopeless 2  PHQ - 2 Score 2   GAD 7 : Generalized Anxiety Score 05/18/2017  Nervous, Anxious, on Edge 3  Control/stop worrying 1  Worry too much - different things 1  Trouble relaxing 1  Restless 1  Easily annoyed or irritable 0  Afraid - awful might happen 0  Total GAD 7 Score 7  Anxiety Difficulty Not difficult at all    Although her scores on the GAD 7 are low, this does not fit patient's description of her level of anxiety.  Objective:   BP 124/62 (BP Location: Right Arm, Patient Position: Sitting)   Pulse 80   Ht 5\' 5"  (1.651 m)   Wt 149 lb (67.6 kg)   LMP 12/19/2016 (Exact Date)   SpO2 98%   BMI 24.79 kg/m  NAD.  Alert, oriented.  Mildly anxious affect.  Difficulty sitting still.  Thoughts logical coherent and relevant.  Dressed appropriately.  Making good eye contact.  Lungs clear.  Heart regular rate and rhythm.  Assessment:   Problem List Items Addressed This Visit      Cardiovascular and Mediastinum   Migraine headache without aura   Relevant Medications   clonazePAM (KLONOPIN) 0.5 MG tablet     Other   Anxiety - Primary   Insomnia       Plan:  Meds ordered this encounter  Medications  . clonazePAM (KLONOPIN) 0.5 MG tablet    Sig: Take 1 tablet (0.5 mg total) by mouth at bedtime.    Dispense:  30 tablet    Refill:  2    Order Specific Question:   Supervising Provider    Answer:   Mikey Kirschner [[id number]]    Increase to 1 1/2 tabs (30 mg) Celexa daily for about a month; if  needed go up to 2 tabs (40 mg).  Go back to the previous dose if any problems.  Contact office with final dose so refills can be given.  Use Klonopin at bedtime as needed for sleep.  Encouraged continued counseling. Return in about 6 months (around 11/16/2017) for recheck. Call back sooner if any problems.  Defers other mental health counseling at this time.

## 2017-05-18 NOTE — Patient Instructions (Signed)
Increase to 1 1/2 tabs Celexa daily for about a month; if needed go up to 2 tabs (40 mg)

## 2017-06-03 ENCOUNTER — Encounter: Payer: Self-pay | Admitting: Nurse Practitioner

## 2017-06-04 ENCOUNTER — Other Ambulatory Visit: Payer: Self-pay | Admitting: Nurse Practitioner

## 2017-06-04 MED ORDER — CLONAZEPAM 0.5 MG PO TABS: ORAL_TABLET | ORAL | 2 refills | Status: DC

## 2017-06-04 MED ORDER — CITALOPRAM HYDROBROMIDE 40 MG PO TABS: 40.0000 mg | ORAL_TABLET | Freq: Every day | ORAL | 1 refills | Status: DC

## 2017-07-28 ENCOUNTER — Ambulatory Visit
Admission: RE | Admit: 2017-07-28 | Discharge: 2017-07-28 | Disposition: A | Discharge status: Home / Self Care | Payer: No Typology Code available for payment source | Source: Ambulatory Visit | Attending: Obstetrics & Gynecology | Admitting: Obstetrics & Gynecology

## 2017-07-28 DIAGNOSIS — Z1231 Encounter for screening mammogram for malignant neoplasm of breast: Secondary | ICD-10-CM

## 2017-08-08 ENCOUNTER — Ambulatory Visit: Payer: Self-pay

## 2017-10-28 ENCOUNTER — Telehealth: Payer: Self-pay | Admitting: Family Medicine

## 2017-10-28 DIAGNOSIS — R5383 Other fatigue: Secondary | ICD-10-CM

## 2017-10-28 DIAGNOSIS — Z79899 Other long term (current) drug therapy: Secondary | ICD-10-CM

## 2017-10-28 DIAGNOSIS — Z13220 Encounter for screening for lipoid disorders: Secondary | ICD-10-CM

## 2017-10-28 NOTE — Telephone Encounter (Signed)
Pt needs lab work for her wellness visits on 11/18/17  Please call when orders are ready

## 2017-10-30 NOTE — Telephone Encounter (Signed)
Lipid, liver, Met 7, TSH and vit D. Thanks.

## 2017-10-31 NOTE — Telephone Encounter (Signed)
Patient is aware orders have been placed.

## 2017-11-07 ENCOUNTER — Encounter: Payer: Self-pay | Admitting: Obstetrics and Gynecology

## 2017-11-14 LAB — BASIC METABOLIC PANEL
BUN/Creatinine Ratio: 16 (ref 9–23)
BUN: 11 mg/dL (ref 6–24)
CALCIUM: 8.5 mg/dL — AB (ref 8.7–10.2)
CHLORIDE: 111 mmol/L — AB (ref 96–106)
CO2: 18 mmol/L — AB (ref 20–29)
Creatinine, Ser: 0.67 mg/dL (ref 0.57–1.00)
GFR calc Af Amer: 120 mL/min/{1.73_m2} (ref >59)
GFR calc non Af Amer: 104 mL/min/{1.73_m2} (ref >59)
Glucose: 82 mg/dL (ref 65–99)
POTASSIUM: 4 mmol/L (ref 3.5–5.2)
Sodium: 142 mmol/L (ref 134–144)

## 2017-11-14 LAB — LIPID PANEL
CHOLESTEROL TOTAL: 152 mg/dL (ref 100–199)
Chol/HDL Ratio: 3.8 ratio (ref 0.0–4.4)
HDL: 40 mg/dL (ref >39)
LDL CALC: 98 mg/dL (ref 0–99)
TRIGLYCERIDES: 70 mg/dL (ref 0–149)
VLDL CHOLESTEROL CAL: 14 mg/dL (ref 5–40)

## 2017-11-14 LAB — HEPATIC FUNCTION PANEL
ALT: 15 IU/L (ref 0–32)
AST: 12 IU/L (ref 0–40)
Albumin: 4.3 g/dL (ref 3.5–5.5)
Alkaline Phosphatase: 82 IU/L (ref 39–117)
Bilirubin Total: 0.5 mg/dL (ref 0.0–1.2)
Bilirubin, Direct: 0.13 mg/dL (ref 0.00–0.40)
Total Protein: 6.2 g/dL (ref 6.0–8.5)

## 2017-11-14 LAB — VITAMIN D 25 HYDROXY (VIT D DEFICIENCY, FRACTURES): VIT D 25 HYDROXY: 30.4 ng/mL (ref 30.0–100.0)

## 2017-11-14 LAB — TSH: TSH: 1.2 u[IU]/mL (ref 0.450–4.500)

## 2017-11-18 ENCOUNTER — Ambulatory Visit (INDEPENDENT_AMBULATORY_CARE_PROVIDER_SITE_OTHER): Payer: No Typology Code available for payment source | Admitting: Family Medicine

## 2017-11-18 ENCOUNTER — Encounter: Payer: Self-pay | Admitting: Family Medicine

## 2017-11-18 VITALS — BP 116/72 | Ht 66.0 in | Wt 150.0 lb

## 2017-11-18 DIAGNOSIS — F419 Anxiety disorder, unspecified: Secondary | ICD-10-CM | POA: Diagnosis not present

## 2017-11-18 DIAGNOSIS — Z0001 Encounter for general adult medical examination with abnormal findings: Secondary | ICD-10-CM

## 2017-11-18 DIAGNOSIS — G43109 Migraine with aura, not intractable, without status migrainosus: Secondary | ICD-10-CM | POA: Diagnosis not present

## 2017-11-18 DIAGNOSIS — G43711 Chronic migraine without aura, intractable, with status migrainosus: Secondary | ICD-10-CM | POA: Diagnosis not present

## 2017-11-18 DIAGNOSIS — Z0000 Encounter for general adult medical examination without abnormal findings: Secondary | ICD-10-CM

## 2017-11-18 MED ORDER — CLONAZEPAM 0.5 MG PO TABS: ORAL_TABLET | ORAL | 2 refills | Status: DC

## 2017-11-18 MED ORDER — TOPIRAMATE 200 MG PO TABS: ORAL_TABLET | ORAL | 1 refills | Status: DC

## 2017-11-18 MED ORDER — PROMETHAZINE HCL 25 MG PO TABS: 25.0000 mg | ORAL_TABLET | Freq: Four times a day (QID) | ORAL | 2 refills | Status: DC | PRN

## 2017-11-18 MED ORDER — ONDANSETRON HCL 4 MG PO TABS: 4.0000 mg | ORAL_TABLET | Freq: Three times a day (TID) | ORAL | 2 refills | Status: DC | PRN
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
## 2017-11-18 NOTE — Patient Instructions (Signed)
Take calcium 600mg / vit d 400 I.u.  Take one a day  Do labs in 3 to 4 months  Do headache calender then send to me

## 2017-11-29 ENCOUNTER — Telehealth: Payer: No Typology Code available for payment source | Admitting: Physician Assistant

## 2017-11-29 DIAGNOSIS — N898 Other specified noninflammatory disorders of vagina: Secondary | ICD-10-CM | POA: Diagnosis not present

## 2017-11-29 MED ORDER — METRONIDAZOLE 0.75 % VA GEL: 1.0000 | Freq: Two times a day (BID) | VAGINAL | 0 refills | Status: DC

## 2017-11-29 NOTE — Progress Notes (Signed)
We are sorry that you are not feeling well. Here is how we plan to help! Based on what you shared with me it looks like you: May have a vaginosis due to bacteria  Vaginosis is an inflammation of the vagina that can result in discharge, itching and pain. The cause is usually a change in the normal balance of vaginal bacteria or an infection. Vaginosis can also result from reduced estrogen levels after menopause.  The most common causes of vaginosis are:   Bacterial vaginosis which results from an overgrowth of one on several organisms that are normally present in your vagina.   Yeast infections which are caused by a naturally occurring fungus called candida.   Vaginal atrophy (atrophic vaginosis) which results from the thinning of the vagina from reduced estrogen levels after menopause.   Trichomoniasis which is caused by a parasite and is commonly transmitted by sexual intercourse.  Factors that increase your risk of developing vaginosis include: Marland Kitchen Medications, such as antibiotics and steroids . Uncontrolled diabetes . Use of hygiene products such as bubble bath, vaginal spray or vaginal deodorant . Douching . Wearing damp or tight-fitting clothing . Using an intrauterine device (IUD) for birth control . Hormonal changes, such as those associated with pregnancy, birth control pills or menopause . Sexual activity . Having a sexually transmitted infection  Your treatment plan is metrogel, one applicator full twice daily for five days. Do not consume alcohol with this medication.   Be sure to take all of the medication as directed. Stop taking any medication if you develop a rash, tongue swelling or shortness of breath. Mothers who are breast feeding should consider pumping and discarding their breast milk while on these antibiotics. However, there is no consensus that infant exposure at these doses would be harmful.  Remember that medication creams can weaken latex condoms. Marland Kitchen   HOME  CARE:  Good hygiene may prevent some types of vaginosis from recurring and may relieve some symptoms:  . Avoid baths, hot tubs and whirlpool spas. Rinse soap from your outer genital area after a shower, and dry the area well to prevent irritation. Don't use scented or harsh soaps, such as those with deodorant or antibacterial action. Marland Kitchen Avoid irritants. These include scented tampons and pads. . Wipe from front to back after using the toilet. Doing so avoids spreading fecal bacteria to your vagina.  Other things that may help prevent vaginosis include:  Marland Kitchen Don't douche. Your vagina doesn't require cleansing other than normal bathing. Repetitive douching disrupts the normal organisms that reside in the vagina and can actually increase your risk of vaginal infection. Douching won't clear up a vaginal infection. . Use a latex condom. Both female and female latex condoms may help you avoid infections spread by sexual contact. . Wear cotton underwear. Also wear pantyhose with a cotton crotch. If you feel comfortable without it, skip wearing underwear to bed. Yeast thrives in [employer] Your symptoms should improve in the next day or two.  GET HELP RIGHT AWAY IF:  . You have pain in your lower abdomen ( pelvic area or over your ovaries) . You develop nausea or vomiting . You develop a fever . Your discharge changes or worsens . You have persistent pain with intercourse . You develop shortness of breath, a rapid pulse, or you faint.  These symptoms could be signs of problems or infections that need to be evaluated by a medical provider now.  MAKE SURE YOU    Understand  these instructions.  Will watch your condition.  Will get help right away if you are not doing well or get worse.  Your e-visit answers were reviewed by a board certified advanced clinical practitioner to complete your personal care plan. Depending upon the condition, your plan could have included both over the counter  or prescription medications. Please review your pharmacy choice to make sure that you have choses a pharmacy that is open for you to pick up any needed prescription, Your safety is important to [place]. If you have drug allergies check your prescription carefully.   You can use MyChart to ask questions about today's visit, request a non-urgent call back, or ask for a work or school excuse for 24 hours related to this e-Visit. If it has been greater than 24 hours you will need to follow up with your provider, or enter a new e-Visit to address those concerns. You will get a MyChart message within the next two days asking about your experience. I hope that your e-visit has been valuable and will speed your recovery.

## 2018-02-20 ENCOUNTER — Telehealth: Payer: No Typology Code available for payment source | Admitting: Family

## 2018-02-20 DIAGNOSIS — R399 Unspecified symptoms and signs involving the genitourinary system: Secondary | ICD-10-CM

## 2018-02-20 MED ORDER — NITROFURANTOIN MONOHYD MACRO 100 MG PO CAPS
100.0000 mg | ORAL_CAPSULE | Freq: Two times a day (BID) | ORAL | 0 refills | Status: DC
Start: 2018-02-20 — End: 2018-11-20

## 2018-02-20 MED ORDER — FLUCONAZOLE 150 MG PO TABS: 150.0000 mg | ORAL_TABLET | Freq: Once | ORAL | 0 refills | Status: AC

## 2018-02-20 NOTE — Addendum Note (Signed)
Addended by: Evelina Dun A on: 02/20/2018 01:06 PM   Modules accepted: Orders

## 2018-02-20 NOTE — Progress Notes (Signed)

## 2018-04-25 ENCOUNTER — Ambulatory Visit (HOSPITAL_COMMUNITY): Payer: No Typology Code available for payment source

## 2018-06-12 ENCOUNTER — Other Ambulatory Visit: Payer: Self-pay | Admitting: Obstetrics & Gynecology

## 2018-06-12 DIAGNOSIS — Z1231 Encounter for screening mammogram for malignant neoplasm of breast: Secondary | ICD-10-CM

## 2018-07-21 ENCOUNTER — Other Ambulatory Visit: Payer: Self-pay | Admitting: Family Medicine

## 2018-07-31 ENCOUNTER — Ambulatory Visit: Payer: No Typology Code available for payment source

## 2018-10-24 ENCOUNTER — Telehealth: Payer: Self-pay | Admitting: Family Medicine

## 2018-10-24 DIAGNOSIS — Z0000 Encounter for general adult medical examination without abnormal findings: Secondary | ICD-10-CM

## 2018-10-24 DIAGNOSIS — Z13220 Encounter for screening for lipoid disorders: Secondary | ICD-10-CM

## 2018-10-24 NOTE — Telephone Encounter (Signed)
Pt has CPE scheduled for 6/22 she would like to have lab work done before appt.

## 2018-10-24 NOTE — Telephone Encounter (Signed)
CBC, lipid, liver, metabolic 7, vitamin D

## 2018-10-24 NOTE — Telephone Encounter (Signed)
Last labs 10/2017: Lipid, Liver, Met 7 TSH and Vit D

## 2018-10-24 NOTE — Telephone Encounter (Signed)
Orders put in and pt notified.  

## 2018-11-11 LAB — HEPATIC FUNCTION PANEL
ALT: 20 IU/L (ref 0–32)
AST: 14 IU/L (ref 0–40)
Albumin: 4.5 g/dL (ref 3.8–4.8)
Alkaline Phosphatase: 69 IU/L (ref 39–117)
Bilirubin Total: 0.4 mg/dL (ref 0.0–1.2)
Bilirubin, Direct: 0.1 mg/dL (ref 0.00–0.40)
Total Protein: 6.5 g/dL (ref 6.0–8.5)

## 2018-11-11 LAB — CBC WITH DIFFERENTIAL/PLATELET
Basophils Absolute: 0 10*3/uL (ref 0.0–0.2)
Basos: 1 %
EOS (ABSOLUTE): 0.2 10*3/uL (ref 0.0–0.4)
Eos: 2 %
Hematocrit: 46.8 % — ABNORMAL HIGH (ref 34.0–46.6)
Hemoglobin: 15.7 g/dL (ref 11.1–15.9)
Immature Grans (Abs): 0 10*3/uL (ref 0.0–0.1)
Immature Granulocytes: 0 %
Lymphocytes Absolute: 1.8 10*3/uL (ref 0.7–3.1)
Lymphs: 28 %
MCH: 30.8 pg (ref 26.6–33.0)
MCHC: 33.5 g/dL (ref 31.5–35.7)
MCV: 92 fL (ref 79–97)
Monocytes Absolute: 0.5 10*3/uL (ref 0.1–0.9)
Monocytes: 8 %
Neutrophils Absolute: 4 10*3/uL (ref 1.4–7.0)
Neutrophils: 61 %
Platelets: 233 10*3/uL (ref 150–450)
RBC: 5.09 x10E6/uL (ref 3.77–5.28)
RDW: 12.9 % (ref 11.7–15.4)
WBC: 6.6 10*3/uL (ref 3.4–10.8)

## 2018-11-11 LAB — BASIC METABOLIC PANEL
BUN/Creatinine Ratio: 16 (ref 9–23)
BUN: 9 mg/dL (ref 6–24)
CO2: 19 mmol/L — ABNORMAL LOW (ref 20–29)
Calcium: 8.8 mg/dL (ref 8.7–10.2)
Chloride: 112 mmol/L — ABNORMAL HIGH (ref 96–106)
Creatinine, Ser: 0.57 mg/dL (ref 0.57–1.00)
GFR calc Af Amer: 126 mL/min/{1.73_m2} (ref >59)
GFR calc non Af Amer: 109 mL/min/{1.73_m2} (ref >59)
Glucose: 85 mg/dL (ref 65–99)
Potassium: 4.1 mmol/L (ref 3.5–5.2)
Sodium: 143 mmol/L (ref 134–144)

## 2018-11-11 LAB — LIPID PANEL
Chol/HDL Ratio: 4.3 ratio (ref 0.0–4.4)
Cholesterol, Total: 173 mg/dL (ref 100–199)
HDL: 40 mg/dL (ref >39)
LDL Calculated: 113 mg/dL — ABNORMAL HIGH (ref 0–99)
Triglycerides: 98 mg/dL (ref 0–149)
VLDL Cholesterol Cal: 20 mg/dL (ref 5–40)

## 2018-11-11 LAB — VITAMIN D 25 HYDROXY (VIT D DEFICIENCY, FRACTURES): Vit D, 25-Hydroxy: 24.6 ng/mL — ABNORMAL LOW (ref 30.0–100.0)

## 2018-11-20 ENCOUNTER — Encounter: Payer: Self-pay | Admitting: Family Medicine

## 2018-11-20 ENCOUNTER — Ambulatory Visit (INDEPENDENT_AMBULATORY_CARE_PROVIDER_SITE_OTHER): Payer: No Typology Code available for payment source | Admitting: Family Medicine

## 2018-11-20 ENCOUNTER — Other Ambulatory Visit: Payer: Self-pay

## 2018-11-20 VITALS — BP 110/68 | Temp 97.9°F | Ht 65.0 in | Wt 145.0 lb

## 2018-11-20 DIAGNOSIS — Z0000 Encounter for general adult medical examination without abnormal findings: Secondary | ICD-10-CM | POA: Diagnosis not present

## 2018-11-20 DIAGNOSIS — G43109 Migraine with aura, not intractable, without status migrainosus: Secondary | ICD-10-CM | POA: Diagnosis not present

## 2018-11-20 MED ORDER — PROMETHAZINE HCL 25 MG PO TABS: 25.0000 mg | ORAL_TABLET | Freq: Four times a day (QID) | ORAL | 2 refills | Status: DC | PRN

## 2018-11-20 MED ORDER — CLONAZEPAM 0.5 MG PO TABS: ORAL_TABLET | ORAL | 2 refills | Status: DC

## 2018-11-20 MED ORDER — TOPIRAMATE 200 MG PO TABS: ORAL_TABLET | ORAL | 3 refills | Status: DC

## 2018-11-20 MED ORDER — ONDANSETRON HCL 4 MG PO TABS: 4.0000 mg | ORAL_TABLET | Freq: Three times a day (TID) | ORAL | 2 refills | Status: DC | PRN

## 2018-11-20 NOTE — Progress Notes (Signed)
   Subjective:    Patient ID: Shirley Murray, female    DOB: 01-31-1971, 48 y.o.   MRN: [id number]  HPI The patient comes in today for a wellness visit.  Patient doing a good job watching diet staying physically active is safe and what she does  A review of their health history was completed.  A review of medications was also completed.  Any needed refills;  All meds  Eating habits: health conscious  Falls/  MVA accidents in past few months: none  Regular exercise:   Specialist pt sees on regular basis: non  Preventative health issues were discussed.   Additional concerns: none    Review of Systems  Constitutional: Negative for activity change, appetite change and fatigue.  HENT: Negative for congestion and rhinorrhea.   Eyes: Negative for discharge.  Respiratory: Negative for cough, chest tightness and wheezing.   Cardiovascular: Negative for chest pain.  Gastrointestinal: Negative for abdominal pain, blood in stool and vomiting.  Endocrine: Negative for polyphagia.  Genitourinary: Negative for difficulty urinating and frequency.  Musculoskeletal: Negative for neck pain.  Skin: Negative for color change.  Allergic/Immunologic: Negative for environmental allergies and food allergies.  Neurological: Negative for weakness and headaches.  Psychiatric/Behavioral: Negative for agitation and behavioral problems.       Objective:   Physical Exam Constitutional:      Appearance: She is well-developed.  HENT:     Head: Normocephalic.     Right Ear: External ear normal.     Left Ear: External ear normal.  Eyes:     Pupils: Pupils are equal, round, and reactive to light.  Neck:     Musculoskeletal: Normal range of motion.     Thyroid: No thyromegaly.  Cardiovascular:     Rate and Rhythm: Normal rate and regular rhythm.     Heart sounds: Normal heart sounds. No murmur.  Pulmonary:     Effort: Pulmonary effort is normal. No respiratory distress.     Breath sounds:  Normal breath sounds. No wheezing.  Abdominal:     General: Bowel sounds are normal. There is no distension.     Palpations: Abdomen is soft. There is no mass.     Tenderness: There is no abdominal tenderness.  Musculoskeletal: Normal range of motion.        General: No tenderness.  Lymphadenopathy:     Cervical: No cervical adenopathy.  Skin:    General: Skin is warm and dry.  Neurological:     Mental Status: She is alert and oriented to person, place, and time.     Motor: No abnormal muscle tone.  Psychiatric:        Behavior: Behavior normal.     Patient does have underlying migraines refills on her medicine were given she will follow-up in 1 year sooner problems      Assessment & Plan:  Adult wellness-complete.wellness physical was conducted today. Importance of diet and exercise were discussed in detail.  In addition to this a discussion regarding safety was also covered. We also reviewed over immunizations and gave recommendations regarding current immunization needed for age.  In addition to this additional areas were also touched on including: Preventative health exams needed:  Colonoscopy next year at age 45  Patient was advised yearly wellness exam

## 2019-05-15 ENCOUNTER — Telehealth: Payer: No Typology Code available for payment source | Admitting: Physician Assistant

## 2019-05-15 DIAGNOSIS — R319 Hematuria, unspecified: Secondary | ICD-10-CM

## 2019-05-15 DIAGNOSIS — N390 Urinary tract infection, site not specified: Secondary | ICD-10-CM

## 2019-05-15 MED ORDER — NITROFURANTOIN MONOHYD MACRO 100 MG PO CAPS: 100.0000 mg | ORAL_CAPSULE | Freq: Two times a day (BID) | ORAL | 0 refills | Status: DC

## 2019-05-15 NOTE — Progress Notes (Signed)
We are sorry that you are not feeling well.  Here is how we plan to help!  Based on what you shared with me it looks like you most likely have a simple urinary tract infection.  A UTI (Urinary Tract Infection) is a bacterial infection of the bladder.  Most cases of urinary tract infections are simple to treat but a key part of your care is to encourage you to drink plenty of fluids and watch your symptoms carefully.  I have prescribed MacroBid 100 mg twice a day for 5 days.  Your symptoms should gradually improve. Call us if the burning in your urine worsens, you develop worsening fever, back pain or pelvic pain or if your symptoms do not resolve after completing the antibiotic.  Urinary tract infections can be prevented by drinking plenty of water to keep your body hydrated.  Also be sure when you wipe, wipe from front to back and don't hold it in!  If possible, empty your bladder every 4 hours.  Your e-visit answers were reviewed by a board certified advanced clinical practitioner to complete your personal care plan.  Depending on the condition, your plan could have included both over the counter or prescription medications.  If there is a problem please reply  once you have received a response from your provider.  Your safety is important to us.  If you have drug allergies check your prescription carefully.    You can use MyChart to ask questions about today's visit, request a non-urgent call back, or ask for a work or school excuse for 24 hours related to this e-Visit. If it has been greater than 24 hours you will need to follow up with your provider, or enter a new e-Visit to address those concerns.   You will get an e-mail in the next two days asking about your experience.  I hope that your e-visit has been valuable and will speed your recovery. Thank you for using e-visits.   Greater than 5 minutes, yet less than 10 minutes of time have been spent researching, coordinating, and  implementing care for this patient today  

## 2019-06-20 ENCOUNTER — Telehealth: Payer: No Typology Code available for payment source | Admitting: Family

## 2019-06-20 DIAGNOSIS — B9689 Other specified bacterial agents as the cause of diseases classified elsewhere: Secondary | ICD-10-CM

## 2019-06-20 DIAGNOSIS — N760 Acute vaginitis: Secondary | ICD-10-CM

## 2019-06-20 MED ORDER — METRONIDAZOLE 500 MG PO TABS: 500.0000 mg | ORAL_TABLET | Freq: Two times a day (BID) | ORAL | 0 refills | Status: DC

## 2019-06-20 MED ORDER — FLUCONAZOLE 150 MG PO TABS: 150.0000 mg | ORAL_TABLET | ORAL | 0 refills | Status: DC | PRN

## 2019-06-20 NOTE — Progress Notes (Signed)
We are sorry that you are not feeling well. Here is how we plan to help! Based on what you shared with me it looks like you: May have a vaginosis due to bacteria  Vaginosis is an inflammation of the vagina that can result in discharge, itching and pain. The cause is usually a change in the normal balance of vaginal bacteria or an infection. Vaginosis can also result from reduced estrogen levels after menopause.  The most common causes of vaginosis are:   Bacterial vaginosis which results from an overgrowth of one on several organisms that are normally present in your vagina.   Yeast infections which are caused by a naturally occurring fungus called candida.   Vaginal atrophy (atrophic vaginosis) which results from the thinning of the vagina from reduced estrogen levels after menopause.   Trichomoniasis which is caused by a parasite and is commonly transmitted by sexual intercourse.  Factors that increase your risk of developing vaginosis include: Marland Kitchen Medications, such as antibiotics and steroids . Uncontrolled diabetes . Use of hygiene products such as bubble bath, vaginal spray or vaginal deodorant . Douching . Wearing damp or tight-fitting clothing . Using an intrauterine device (IUD) for birth control . Hormonal changes, such as those associated with pregnancy, birth control pills or menopause . Sexual activity . Having a sexually transmitted infection  Your treatment plan is Metronidazole or Flagyl 500mg  twice a day for 7 days.  I have electronically sent this prescription into the pharmacy that you have chosen. I have also sent in a prescription of diflucan to your pharmacy.   Be sure to take all of the medication as directed. Stop taking any medication if you develop a rash, tongue swelling or shortness of breath. Mothers who are breast feeding should consider pumping and discarding their breast milk while on these antibiotics. However, there is no consensus that infant exposure at  these doses would be harmful.  Remember that medication creams can weaken latex condoms. Marland Kitchen   HOME CARE:  Good hygiene may prevent some types of vaginosis from recurring and may relieve some symptoms:  . Avoid baths, hot tubs and whirlpool spas. Rinse soap from your outer genital area after a shower, and dry the area well to prevent irritation. Don't use scented or harsh soaps, such as those with deodorant or antibacterial action. Marland Kitchen Avoid irritants. These include scented tampons and pads. . Wipe from front to back after using the toilet. Doing so avoids spreading fecal bacteria to your vagina.  Other things that may help prevent vaginosis include:  Marland Kitchen Don't douche. Your vagina doesn't require cleansing other than normal bathing. Repetitive douching disrupts the normal organisms that reside in the vagina and can actually increase your risk of vaginal infection. Douching won't clear up a vaginal infection. . Use a latex condom. Both female and female latex condoms may help you avoid infections spread by sexual contact. . Wear cotton underwear. Also wear pantyhose with a cotton crotch. If you feel comfortable without it, skip wearing underwear to bed. Yeast thrives in [employer] Your symptoms should improve in the next day or two.  GET HELP RIGHT AWAY IF:  . You have pain in your lower abdomen ( pelvic area or over your ovaries) . You develop nausea or vomiting . You develop a fever . Your discharge changes or worsens . You have persistent pain with intercourse . You develop shortness of breath, a rapid pulse, or you faint.  These symptoms could be signs of problems  or infections that need to be evaluated by a medical provider now.  MAKE SURE YOU    Understand these instructions.  Will watch your condition.  Will get help right away if you are not doing well or get worse.  Your e-visit answers were reviewed by a board certified advanced clinical practitioner to complete your  personal care plan. Depending upon the condition, your plan could have included both over the counter or prescription medications. Please review your pharmacy choice to make sure that you have choses a pharmacy that is open for you to pick up any needed prescription, Your safety is important to [place]. If you have drug allergies check your prescription carefully.   You can use MyChart to ask questions about today's visit, request a non-urgent call back, or ask for a work or school excuse for 24 hours related to this e-Visit. If it has been greater than 24 hours you will need to follow up with your provider, or enter a new e-Visit to address those concerns. You will get a MyChart message within the next two days asking about your experience. I hope that your e-visit has been valuable and will speed your recovery.  Approximately 5 minutes was spent documenting and reviewing patient's chart.

## 2019-07-02 ENCOUNTER — Other Ambulatory Visit: Payer: Self-pay | Admitting: Family Medicine

## 2019-07-02 NOTE — Telephone Encounter (Signed)
May have 1 refill only needs virtual visit before further please pend

## 2019-07-17 ENCOUNTER — Telehealth: Payer: No Typology Code available for payment source | Admitting: Emergency Medicine

## 2019-07-17 DIAGNOSIS — L709 Acne, unspecified: Secondary | ICD-10-CM | POA: Diagnosis not present

## 2019-07-17 MED ORDER — DOXYCYCLINE HYCLATE 100 MG PO CAPS: 100.0000 mg | ORAL_CAPSULE | Freq: Two times a day (BID) | ORAL | 0 refills | Status: DC

## 2019-07-17 NOTE — Progress Notes (Signed)
We are sorry that you are experiencing this issue.  Here is how we plan to help!  Based on what you shared with me it looks like you have uncomplicated acne.  Acne is a disorder of the hair follicles and oil glands (sebaceous glands). The sebaceous glands secrete oils to keep the skin moist.  When the glands get clogged, it can lead to pimples or cysts.  These cysts may become infected and leave scars. Acne is very common and normally occurs at puberty.  Acne is also inherited.  Your personal care plan consists of the following recommendations:  I recommend that you use a daily cleanser  You might try an over the counter cleanser that has benzoyl peroxide.  I recommend that you start with a product that has 2.5% benzoyl peroxide.  Stronger concentrations have not been shown to be more effective.  I have also prescribed one of the following additional therapies:  Doxycycline an oral antibiotic 100 mg twice a day  If excessive dryness or peeling occurs, reduce dose frequency or concentration of the topical scrubs.  If excessive stinging or burning occurs, remove the topical gel with mild soap and water and resume at a lower dose the next day.  Remember oral antibiotics and topical acne treatments may increase your sensitivity to the sun!  HOME CARE:  Do not squeeze pimples because that can often lead to infections, worse acne, and scars.  Use a moisturizer that contains retinoid or fruit acids that may inhibit the development of new acne lesions.  Although there is not a clear link that foods can cause acne, doctors do believe that too many sweets predispose you to skin problems.  GET HELP RIGHT AWAY IF:  If your acne gets worse or is not better within 10 days.  If you become depressed.  If you become pregnant, discontinue medications and call your OB/GYN.  MAKE SURE YOU:  Understand these instructions.  Will watch your condition.  Will get help right away if you are not doing  well or get worse.   Your e-visit answers were reviewed by a board certified advanced clinical practitioner to complete your personal care plan.  Depending upon the condition, your plan could have included both over the counter or prescription medications.  Please review your pharmacy choice.  If there is a problem, you may contact your provider through [employer] and have the prescription routed to another pharmacy.  Your safety is important to [place].  If you have drug allergies check your prescription carefully.  For the next 24 hours you can use MyChart to ask questions about today's visit, request a non-urgent call back, or ask for a work or school excuse from your e-visit provider.  You will get an email in the next two days asking about your experience. I hope that your e-visit has been valuable and will speed your recovery.  Approximately 5 minutes was used in reviewing the patient's chart, questionnaire, prescribing medications, and documentation.

## 2019-08-22 ENCOUNTER — Other Ambulatory Visit: Payer: Self-pay | Admitting: Adult Health

## 2019-08-22 DIAGNOSIS — E7800 Pure hypercholesterolemia, unspecified: Secondary | ICD-10-CM

## 2019-08-22 DIAGNOSIS — Z131 Encounter for screening for diabetes mellitus: Secondary | ICD-10-CM

## 2019-08-22 DIAGNOSIS — E559 Vitamin D deficiency, unspecified: Secondary | ICD-10-CM

## 2019-08-22 DIAGNOSIS — Z01419 Encounter for gynecological examination (general) (routine) without abnormal findings: Secondary | ICD-10-CM

## 2019-08-22 NOTE — Progress Notes (Signed)
Will order labs has physical in April

## 2019-08-29 LAB — COMPREHENSIVE METABOLIC PANEL
ALT: 15 IU/L (ref 0–32)
AST: 20 IU/L (ref 0–40)
Albumin/Globulin Ratio: 2.3 — ABNORMAL HIGH (ref 1.2–2.2)
Albumin: 4.3 g/dL (ref 3.8–4.8)
Alkaline Phosphatase: 70 IU/L (ref 39–117)
BUN/Creatinine Ratio: 16 (ref 9–23)
BUN: 9 mg/dL (ref 6–24)
Bilirubin Total: 1 mg/dL (ref 0.0–1.2)
CO2: 23 mmol/L (ref 20–29)
Calcium: 8.9 mg/dL (ref 8.7–10.2)
Chloride: 104 mmol/L (ref 96–106)
Creatinine, Ser: 0.58 mg/dL (ref 0.57–1.00)
GFR calc Af Amer: 125 mL/min/{1.73_m2} (ref >59)
GFR calc non Af Amer: 109 mL/min/{1.73_m2} (ref >59)
Globulin, Total: 1.9 g/dL (ref 1.5–4.5)
Glucose: 79 mg/dL (ref 65–99)
Potassium: 3.8 mmol/L (ref 3.5–5.2)
Sodium: 142 mmol/L (ref 134–144)
Total Protein: 6.2 g/dL (ref 6.0–8.5)

## 2019-08-29 LAB — CBC
Hematocrit: 44.4 % (ref 34.0–46.6)
Hemoglobin: 15.3 g/dL (ref 11.1–15.9)
MCH: 32.2 pg (ref 26.6–33.0)
MCHC: 34.5 g/dL (ref 31.5–35.7)
MCV: 94 fL (ref 79–97)
Platelets: 268 10*3/uL (ref 150–450)
RBC: 4.75 x10E6/uL (ref 3.77–5.28)
RDW: 12.9 % (ref 11.7–15.4)
WBC: 7.8 10*3/uL (ref 3.4–10.8)

## 2019-08-29 LAB — LIPID PANEL
Chol/HDL Ratio: 3.1 ratio (ref 0.0–4.4)
Cholesterol, Total: 149 mg/dL (ref 100–199)
HDL: 48 mg/dL (ref >39)
LDL Chol Calc (NIH): 86 mg/dL (ref 0–99)
Triglycerides: 75 mg/dL (ref 0–149)
VLDL Cholesterol Cal: 15 mg/dL (ref 5–40)

## 2019-08-29 LAB — HEMOGLOBIN A1C
Est. average glucose Bld gHb Est-mCnc: 91 mg/dL
Hgb A1c MFr Bld: 4.8 % (ref 4.8–5.6)

## 2019-08-29 LAB — VITAMIN D 25 HYDROXY (VIT D DEFICIENCY, FRACTURES): Vit D, 25-Hydroxy: 42.8 ng/mL (ref 30.0–100.0)

## 2019-08-29 LAB — TSH: TSH: 1.16 u[IU]/mL (ref 0.450–4.500)

## 2019-09-04 ENCOUNTER — Encounter: Payer: Self-pay | Admitting: Adult Health

## 2019-09-04 ENCOUNTER — Other Ambulatory Visit: Payer: Self-pay

## 2019-09-04 ENCOUNTER — Ambulatory Visit (INDEPENDENT_AMBULATORY_CARE_PROVIDER_SITE_OTHER): Payer: No Typology Code available for payment source | Admitting: Adult Health

## 2019-09-04 VITALS — BP 98/65 | HR 78 | Ht 65.75 in | Wt 126.4 lb

## 2019-09-04 DIAGNOSIS — Z1212 Encounter for screening for malignant neoplasm of rectum: Secondary | ICD-10-CM | POA: Diagnosis not present

## 2019-09-04 DIAGNOSIS — Z90710 Acquired absence of both cervix and uterus: Secondary | ICD-10-CM

## 2019-09-04 DIAGNOSIS — Z1211 Encounter for screening for malignant neoplasm of colon: Secondary | ICD-10-CM

## 2019-09-04 DIAGNOSIS — Z01419 Encounter for gynecological examination (general) (routine) without abnormal findings: Secondary | ICD-10-CM

## 2019-09-04 LAB — HEMOCCULT GUIAC POC 1CARD (OFFICE): Fecal Occult Blood, POC: NEGATIVE

## 2019-09-04 NOTE — Progress Notes (Signed)
Patient ID: Shirley Murray, female   DOB: Mar 23, 1971, 49 y.o.   MRN: [id number] History of Present Illness: Shirley Murray is a 49 year old white female, divorced, sp hysterectomy in for a well woman gyn exam. PCP is Sallee Lange MD.    Current Medications, Allergies, Past Medical History, Past Surgical History, Family History and Social History were reviewed in [employer] record.     Review of Systems: Patient denies any headaches, hearing loss, fatigue, blurred vision, shortness of breath, chest pain, abdominal pain, problems with bowel movements, urination, or intercourse(not active). No joint pain or mood swings.    Physical Exam:BP 98/65 (BP Location: Left Arm, Patient Position: Sitting, Cuff Size: Normal)   Pulse 78   Ht 5' 5.75" (1.67 m)   Wt 126 lb 6.4 oz (57.3 kg)   LMP 12/19/2016 (Exact Date)   BMI 20.56 kg/m  General:  Well developed, well nourished, no acute distress Skin:  Warm and dry Neck:  Midline trachea, normal thyroid, good ROM, no lymphadenopathy Lungs; Clear to auscultation bilaterally Breast:  No dominant palpable mass, retraction, or nipple discharge Cardiovascular: Regular rate and rhythm Abdomen:  Soft, non tender, no hepatosplenomegaly Pelvic:  External genitalia is normal in appearance, no lesions.  The vagina is normal in appearance. Urethra has no lesions or masses. The cervix and uterus.  No adnexal masses or tenderness noted.Bladder is non tender, no masses felt. Rectal: Good sphincter tone, no polyps, or hemorrhoids felt.  Hemoccult negative. Extremities/musculoskeletal:  No swelling or varicosities noted, no clubbing or cyanosis Psych:  No mood changes, alert and cooperative,seems happy Alcohol audit is 3 Fall risk is low PHQ 9, is 3, no SI. Examination chaperoned by Estill Bamberg Rash LPN.   Impression and Plan: 1. Encounter for well woman exam with routine gynecological exam Physical in 1 year Mammogram yearly Labs in 1  year   2. Screening for colorectal cancer Colonoscopy at 6

## 2019-09-21 ENCOUNTER — Telehealth: Payer: No Typology Code available for payment source | Admitting: Nurse Practitioner

## 2019-09-21 DIAGNOSIS — L700 Acne vulgaris: Secondary | ICD-10-CM | POA: Diagnosis not present

## 2019-09-21 MED ORDER — BENZOYL PEROXIDE-ERYTHROMYCIN 5-3 % EX GEL: Freq: Two times a day (BID) | CUTANEOUS | 0 refills | Status: DC

## 2019-09-21 MED ORDER — DOXYCYCLINE HYCLATE 100 MG PO TABS: 100.0000 mg | ORAL_TABLET | Freq: Two times a day (BID) | ORAL | 0 refills | Status: DC

## 2019-09-21 NOTE — Progress Notes (Signed)
We are sorry that you are experiencing this issue.  Here is how we plan to help!  Based on what you shared with me it looks like you have cystic acne.  Acne is a disorder of the hair follicles and oil glands (sebaceous glands). The sebaceous glands secrete oils to keep the skin moist.  When the glands get clogged, it can lead to pimples or cysts.  These cysts may become infected and leave scars. Acne is very common and normally occurs at puberty.  Acne is also inherited.  Your personal care plan consists of the following recommendations:  I recommend that you use a daily cleanser  You might try an over the counter cleanser that has benzoyl peroxide.  I recommend that you start with a product that has 2.5% benzoyl peroxide.  Stronger concentrations have not been shown to be more effective.  I have prescribed a topical gel with an antibiotic:  Benzoyl peroxide-erythromycin gel.  This gel should be applied to the affected areas twice a day. Be sure to read the package insert for potential side effects.  I have also prescribed one of the following additional therapies:  Doxycycline an oral antibiotic 100 mg twice a day  If excessive dryness or peeling occurs, reduce dose frequency or concentration of the topical scrubs.  If excessive stinging or burning occurs, remove the topical gel with mild soap and water and resume at a lower dose the next day.  Remember oral antibiotics and topical acne treatments may increase your sensitivity to the sun!  HOME CARE:  Do not squeeze pimples because that can often lead to infections, worse acne, and scars.  Use a moisturizer that contains retinoid or fruit acids that may inhibit the development of new acne lesions.  Although there is not a clear link that foods can cause acne, doctors do believe that too many sweets predispose you to skin problems.  GET HELP RIGHT AWAY IF:  If your acne gets worse or is not better within 10 days.  If you become  depressed.  If you become pregnant, discontinue medications and call your OB/GYN.  MAKE SURE YOU:  Understand these instructions.  Will watch your condition.  Will get help right away if you are not doing well or get worse.   Your e-visit answers were reviewed by a board certified advanced clinical practitioner to complete your personal care plan.  Depending upon the condition, your plan could have included both over the counter or prescription medications.  Please review your pharmacy choice.  If there is a problem, you may contact your provider through [employer] and have the prescription routed to another pharmacy.  Your safety is important to [place].  If you have drug allergies check your prescription carefully.  For the next 24 hours you can use MyChart to ask questions about today's visit, request a non-urgent call back, or ask for a work or school excuse from your e-visit provider.  You will get an email in the next two days asking about your experience. I hope that your e-visit has been valuable and will speed your recovery.  5-10 minutes spent reviewing and documenting in chart.

## 2019-11-07 ENCOUNTER — Other Ambulatory Visit: Payer: Self-pay | Admitting: Family Medicine

## 2019-11-08 NOTE — Telephone Encounter (Signed)
Scheduled 7/9

## 2019-11-09 MED ORDER — CLONAZEPAM 0.5 MG PO TABS: ORAL_TABLET | ORAL | 0 refills | Status: DC

## 2019-11-11 ENCOUNTER — Telehealth: Payer: No Typology Code available for payment source | Admitting: Physician Assistant

## 2019-11-11 DIAGNOSIS — N760 Acute vaginitis: Secondary | ICD-10-CM

## 2019-11-11 MED ORDER — METRONIDAZOLE 500 MG PO TABS: 500.0000 mg | ORAL_TABLET | Freq: Two times a day (BID) | ORAL | 0 refills | Status: DC

## 2019-11-11 MED ORDER — FLUCONAZOLE 150 MG PO TABS: 150.0000 mg | ORAL_TABLET | Freq: Once | ORAL | 0 refills | Status: AC

## 2019-11-11 NOTE — Progress Notes (Signed)
We are sorry that you are not feeling well. Here is how we plan to help! Based on what you shared with me it looks like you: May have a vaginosis due to bacteria  Vaginosis is an inflammation of the vagina that can result in discharge, itching and pain. The cause is usually a change in the normal balance of vaginal bacteria or an infection. Vaginosis can also result from reduced estrogen levels after menopause.  The most common causes of vaginosis are:   Bacterial vaginosis which results from an overgrowth of one on several organisms that are normally present in your vagina.   Yeast infections which are caused by a naturally occurring fungus called candida.   Vaginal atrophy (atrophic vaginosis) which results from the thinning of the vagina from reduced estrogen levels after menopause.   Trichomoniasis which is caused by a parasite and is commonly transmitted by sexual intercourse.  Factors that increase your risk of developing vaginosis include: Marland Kitchen Medications, such as antibiotics and steroids . Uncontrolled diabetes . Use of hygiene products such as bubble bath, vaginal spray or vaginal deodorant . Douching . Wearing damp or tight-fitting clothing . Using an intrauterine device (IUD) for birth control . Hormonal changes, such as those associated with pregnancy, birth control pills or menopause . Sexual activity . Having a sexually transmitted infection  Your treatment plan is Metronidazole or Flagyl 500mg  twice a day for 7 days.  I have electronically sent this prescription into the pharmacy that you have chosen.  I have also prescribed Diflucan in case you experience signs of a yeast infection.   Be sure to take all of the medication as directed. Stop taking any medication if you develop a rash, tongue swelling or shortness of breath. Mothers who are breast feeding should consider pumping and discarding their breast milk while on these antibiotics. However, there is no consensus that  infant exposure at these doses would be harmful.  Remember that medication creams can weaken latex condoms. Marland Kitchen   HOME CARE:  Good hygiene may prevent some types of vaginosis from recurring and may relieve some symptoms:  . Avoid baths, hot tubs and whirlpool spas. Rinse soap from your outer genital area after a shower, and dry the area well to prevent irritation. Don't use scented or harsh soaps, such as those with deodorant or antibacterial action. Marland Kitchen Avoid irritants. These include scented tampons and pads. . Wipe from front to back after using the toilet. Doing so avoids spreading fecal bacteria to your vagina.  Other things that may help prevent vaginosis include:  Marland Kitchen Don't douche. Your vagina doesn't require cleansing other than normal bathing. Repetitive douching disrupts the normal organisms that reside in the vagina and can actually increase your risk of vaginal infection. Douching won't clear up a vaginal infection. . Use a latex condom. Both female and female latex condoms may help you avoid infections spread by sexual contact. . Wear cotton underwear. Also wear pantyhose with a cotton crotch. If you feel comfortable without it, skip wearing underwear to bed. Yeast thrives in [employer] Your symptoms should improve in the next day or two.  GET HELP RIGHT AWAY IF:  . You have pain in your lower abdomen ( pelvic area or over your ovaries) . You develop nausea or vomiting . You develop a fever . Your discharge changes or worsens . You have persistent pain with intercourse . You develop shortness of breath, a rapid pulse, or you faint.  These symptoms could be  signs of problems or infections that need to be evaluated by a medical provider now.  MAKE SURE YOU    Understand these instructions.  Will watch your condition.  Will get help right away if you are not doing well or get worse.  Your e-visit answers were reviewed by a board certified advanced clinical  practitioner to complete your personal care plan. Depending upon the condition, your plan could have included both over the counter or prescription medications. Please review your pharmacy choice to make sure that you have choses a pharmacy that is open for you to pick up any needed prescription, Your safety is important to [place]. If you have drug allergies check your prescription carefully.   You can use MyChart to ask questions about today's visit, request a non-urgent call back, or ask for a work or school excuse for 24 hours related to this e-Visit. If it has been greater than 24 hours you will need to follow up with your provider, or enter a new e-Visit to address those concerns. You will get a MyChart message within the next two days asking about your experience. I hope that your e-visit has been valuable and will speed your recovery.  Greater than 5 minutes, yet less than 10 minutes of time have been spent researching, coordinating and implementing care for this patient today.

## 2019-12-07 ENCOUNTER — Other Ambulatory Visit: Payer: Self-pay | Admitting: Family Medicine

## 2019-12-07 ENCOUNTER — Encounter: Payer: Self-pay | Admitting: Family Medicine

## 2019-12-07 ENCOUNTER — Ambulatory Visit (INDEPENDENT_AMBULATORY_CARE_PROVIDER_SITE_OTHER): Payer: No Typology Code available for payment source | Admitting: Family Medicine

## 2019-12-07 ENCOUNTER — Other Ambulatory Visit: Payer: Self-pay

## 2019-12-07 VITALS — BP 108/72 | Temp 97.8°F | Wt 122.8 lb

## 2019-12-07 DIAGNOSIS — G4700 Insomnia, unspecified: Secondary | ICD-10-CM | POA: Diagnosis not present

## 2019-12-07 DIAGNOSIS — F419 Anxiety disorder, unspecified: Secondary | ICD-10-CM | POA: Diagnosis not present

## 2019-12-07 DIAGNOSIS — G43109 Migraine with aura, not intractable, without status migrainosus: Secondary | ICD-10-CM | POA: Diagnosis not present

## 2019-12-07 MED ORDER — ONDANSETRON HCL 4 MG PO TABS: 4.0000 mg | ORAL_TABLET | Freq: Three times a day (TID) | ORAL | 2 refills | Status: DC | PRN

## 2019-12-07 MED ORDER — PROMETHAZINE HCL 25 MG PO TABS: 25.0000 mg | ORAL_TABLET | Freq: Four times a day (QID) | ORAL | 2 refills | Status: DC | PRN

## 2019-12-07 MED ORDER — CLONAZEPAM 0.5 MG PO TABS: ORAL_TABLET | ORAL | 0 refills | Status: DC

## 2019-12-07 NOTE — Progress Notes (Signed)
   Subjective:    Patient ID: Shirley Murray, female    DOB: April 13, 1971, 49 y.o.   MRN: [id number]  HPI Patient comes in today for follow up on medications. Patient states that she is started working really hard on eating healthy.  Staying physically active.  She walks several miles per day.  She is pretty much cut out most simple sugars from her diet. Patient reports some low sugar readings lately and feeling dizzy a few times.  She states she is checked her sugar a couple different times at work and it was in the 58s. Has purposefully lost some weight recently. We did discuss diet I encouraged her not to be too restrictive and to liberalize her carbohydrate intake and talked about some healthy carbohydrates she could use Review of Systems     Objective:   Physical Exam  Lungs clear heart regular HEENT benign weight noted      Assessment & Plan:  Low sugar spells she needs to be a little more liberal with her carbohydrates to prevent knees.  Her weight is coming down.  This is from a combination of exercise watching her diet.  I have encouraged her not to try to lose any further weight.  I have also encouraged her to liberalize her diet to allow enough carbohydrates to avoid low sugars.  Uses clonazepam intermittently for insomnia and stress not to use frequently  Has migraines typically uses Zofran or Phenergan refills were sent migraines have been stable  Patient denies depression recently divorced.  Exercises on a regular basis

## 2020-01-30 ENCOUNTER — Other Ambulatory Visit: Payer: No Typology Code available for payment source

## 2020-01-30 ENCOUNTER — Other Ambulatory Visit: Payer: Self-pay

## 2020-01-30 DIAGNOSIS — R319 Hematuria, unspecified: Secondary | ICD-10-CM

## 2020-01-31 LAB — URINALYSIS, ROUTINE W REFLEX MICROSCOPIC
Bilirubin, UA: NEGATIVE
Glucose, UA: NEGATIVE
Ketones, UA: NEGATIVE
Leukocytes,UA: NEGATIVE
Nitrite, UA: NEGATIVE
Protein,UA: NEGATIVE
RBC, UA: NEGATIVE
Specific Gravity, UA: 1.007 (ref 1.005–1.030)
Urobilinogen, Ur: 0.2 mg/dL (ref 0.2–1.0)
pH, UA: 6.5 (ref 5.0–7.5)

## 2020-02-01 LAB — URINE CULTURE: Organism ID, Bacteria: NO GROWTH

## 2020-02-07 ENCOUNTER — Telehealth: Payer: Self-pay | Admitting: Adult Health

## 2020-02-07 MED ORDER — CEPHALEXIN 500 MG PO CAPS
500.0000 mg | ORAL_CAPSULE | Freq: Four times a day (QID) | ORAL | 0 refills | Status: DC
Start: 2020-02-07 — End: 2020-06-05

## 2020-02-07 MED ORDER — FLUCONAZOLE 150 MG PO TABS
ORAL_TABLET | ORAL | 1 refills | Status: DC
Start: 2020-02-07 — End: 2020-05-27

## 2020-02-07 NOTE — Telephone Encounter (Signed)
Has sore throat, has allergies,no fever, and has pustule in left tonsil, will rx keflex, and warm salt water gargles

## 2020-02-20 ENCOUNTER — Telehealth: Payer: Self-pay | Admitting: Adult Health

## 2020-02-20 MED ORDER — SERTRALINE HCL 25 MG PO TABS
25.0000 mg | ORAL_TABLET | Freq: Every day | ORAL | 3 refills | Status: DC
Start: 2020-02-20 — End: 2020-08-01

## 2020-02-20 NOTE — Telephone Encounter (Signed)
Pt having some depression, has seen EAP for 3 years, will rx zoloft 25 1 daily for now and will increase dose if needed

## 2020-03-05 ENCOUNTER — Other Ambulatory Visit: Payer: Self-pay | Admitting: Family Medicine

## 2020-03-05 ENCOUNTER — Encounter: Payer: Self-pay | Admitting: Family Medicine

## 2020-03-05 MED ORDER — CLONAZEPAM 0.5 MG PO TABS: ORAL_TABLET | ORAL | 3 refills | Status: DC

## 2020-03-05 NOTE — Telephone Encounter (Signed)
12/07/19 was last med check up

## 2020-03-16 ENCOUNTER — Telehealth: Payer: No Typology Code available for payment source | Admitting: Nurse Practitioner

## 2020-03-16 DIAGNOSIS — N3000 Acute cystitis without hematuria: Secondary | ICD-10-CM

## 2020-03-16 MED ORDER — FLUCONAZOLE 150 MG PO TABS: 150.0000 mg | ORAL_TABLET | Freq: Once | ORAL | 0 refills | Status: AC

## 2020-03-16 MED ORDER — NITROFURANTOIN MONOHYD MACRO 100 MG PO CAPS: 100.0000 mg | ORAL_CAPSULE | Freq: Two times a day (BID) | ORAL | 0 refills | Status: AC

## 2020-03-16 NOTE — Progress Notes (Signed)
We are sorry that you are not feeling well.  Here is how we plan to help!  Based on what you shared with me it looks like you most likely have a simple urinary tract infection.  A UTI (Urinary Tract Infection) is a bacterial infection of the bladder.  Most cases of urinary tract infections are simple to treat but a key part of your care is to encourage you to drink plenty of fluids and watch your symptoms carefully.  I have prescribed MacroBid 100 mg twice a day for 5 days.  Your symptoms should gradually improve. Call us if the burning in your urine worsens, you develop worsening fever, back pain or pelvic pain or if your symptoms do not resolve after completing the antibiotic.  Urinary tract infections can be prevented by drinking plenty of water to keep your body hydrated.  Also be sure when you wipe, wipe from front to back and don't hold it in!  If possible, empty your bladder every 4 hours.  Your e-visit answers were reviewed by a board certified advanced clinical practitioner to complete your personal care plan.  Depending on the condition, your plan could have included both over the counter or prescription medications.  If there is a problem please reply  once you have received a response from your provider.  Your safety is important to us.  If you have drug allergies check your prescription carefully.    You can use MyChart to ask questions about today's visit, request a non-urgent call back, or ask for a work or school excuse for 24 hours related to this e-Visit. If it has been greater than 24 hours you will need to follow up with your provider, or enter a new e-Visit to address those concerns.   You will get an e-mail in the next two days asking about your experience.  I hope that your e-visit has been valuable and will speed your recovery. Thank you for using e-visits.  I have spent at least 5 minutes reviewing and documenting in the patient's chart.  

## 2020-05-27 ENCOUNTER — Telehealth: Payer: No Typology Code available for payment source | Admitting: Physician Assistant

## 2020-05-27 DIAGNOSIS — J019 Acute sinusitis, unspecified: Secondary | ICD-10-CM

## 2020-05-27 MED ORDER — DOXYCYCLINE HYCLATE 100 MG PO CAPS: 100.0000 mg | ORAL_CAPSULE | Freq: Two times a day (BID) | ORAL | 0 refills | Status: DC

## 2020-05-27 MED ORDER — FLUCONAZOLE 150 MG PO TABS: 150.0000 mg | ORAL_TABLET | Freq: Once | ORAL | 0 refills | Status: AC

## 2020-05-27 NOTE — Progress Notes (Signed)
We are sorry that you are not feeling well.  Here is how we plan to help!  Based on what you have shared with me it looks like you have sinusitis.  Sinusitis is inflammation and infection in the sinus cavities of the head.  Based on your presentation I believe you most likely have Acute Bacterial Sinusitis.  This is an infection caused by bacteria and is treated with antibiotics. I have prescribed Doxycycline 100mg  by mouth twice a day for 10 days. and diflucan. You may use an oral decongestant such as Mucinex D or if you have glaucoma or high blood pressure use plain Mucinex. Saline nasal spray help and can safely be used as often as needed for congestion.  If you develop worsening sinus pain, fever or notice severe headache and vision changes, or if symptoms are not better after completion of antibiotic, please schedule an appointment with a health care provider.    Sinus infections are not as easily transmitted as other respiratory infection, however we still recommend that you avoid close contact with loved ones, especially the very young and elderly.  Remember to wash your hands thoroughly throughout the day as this is the number one way to prevent the spread of infection!  Home Care:  Only take medications as instructed by your medical team.  Complete the entire course of an antibiotic.  Do not take these medications with alcohol.  A steam or ultrasonic humidifier can help congestion.  You can place a towel over your head and breathe in the steam from hot water coming from a faucet.  Avoid close contacts especially the very young and the elderly.  Cover your mouth when you cough or sneeze.  Always remember to wash your hands.  Get Help Right Away If:  You develop worsening fever or sinus pain.  You develop a severe head ache or visual changes.  Your symptoms persist after you have completed your treatment plan.  Make sure you  Understand these instructions.  Will watch your  condition.  Will get help right away if you are not doing well or get worse.  Your e-visit answers were reviewed by a board certified advanced clinical practitioner to complete your personal care plan.  Depending on the condition, your plan could have included both over the counter or prescription medications.  If there is a problem please reply  once you have received a response from your provider.  Your safety is important to .  If you have drug allergies check your prescription carefully.    You can use MyChart to ask questions about today's visit, request a non-urgent call back, or ask for a work or school excuse for 24 hours related to this e-Visit. If it has been greater than 24 hours you will need to follow up with your provider, or enter a new e-Visit to address those concerns.  You will get an e-mail in the next two days asking about your experience.  I hope that your e-visit has been valuable and will speed your recovery. Thank you for using e-visits.   Greater than 5 minutes, yet less than 10 minutes of time have been spent researching, coordinating, and implementing care for this patient today

## 2020-06-05 ENCOUNTER — Telehealth: Payer: No Typology Code available for payment source | Admitting: Orthopedic Surgery

## 2020-06-05 DIAGNOSIS — J0191 Acute recurrent sinusitis, unspecified: Secondary | ICD-10-CM

## 2020-06-05 MED ORDER — LEVOFLOXACIN 500 MG PO TABS: 500.0000 mg | ORAL_TABLET | Freq: Every day | ORAL | 0 refills | Status: DC

## 2020-06-05 MED ORDER — FLUCONAZOLE 150 MG PO TABS: 150.0000 mg | ORAL_TABLET | Freq: Once | ORAL | 0 refills | Status: AC

## 2020-06-05 NOTE — Progress Notes (Signed)
We are sorry that you are not feeling well.  Here is how we plan to help!  Based on what you have shared with me it looks like you have sinusitis.  Sinusitis is inflammation and infection in the sinus cavities of the head.  Based on your presentation I believe you most likely have Acute Bacterial Sinusitis.  This is an infection caused by bacteria and is treated with antibiotics. I have prescribed Levofloxicin 500mg  by mouth once daily for 7 days. You may use an oral decongestant such as Mucinex D or if you have glaucoma or high blood pressure use plain Mucinex. Saline nasal spray help and can safely be used as often as needed for congestion.  If you develop worsening sinus pain, fever or notice severe headache and vision changes, or if symptoms are not better after completion of antibiotic, please schedule an appointment with a health care provider.    Sinus infections are not as easily transmitted as other respiratory infection, however we still recommend that you avoid close contact with loved ones, especially the very young and elderly.  Remember to wash your hands thoroughly throughout the day as this is the number one way to prevent the spread of infection!  Home Care:  Only take medications as instructed by your medical team.  Complete the entire course of an antibiotic.  Do not take these medications with alcohol.  A steam or ultrasonic humidifier can help congestion.  You can place a towel over your head and breathe in the steam from hot water coming from a faucet.  Avoid close contacts especially the very young and the elderly.  Cover your mouth when you cough or sneeze.  Always remember to wash your hands.  Get Help Right Away If:  You develop worsening fever or sinus pain.  You develop a severe head ache or visual changes.  Your symptoms persist after you have completed your treatment plan.  Make sure you  Understand these instructions.  Will watch your  condition.  Will get help right away if you are not doing well or get worse.  Your e-visit answers were reviewed by a board certified advanced clinical practitioner to complete your personal care plan.  Depending on the condition, your plan could have included both over the counter or prescription medications.  If there is a problem please reply  once you have received a response from your provider.  Your safety is important to .  If you have drug allergies check your prescription carefully.    You can use MyChart to ask questions about today's visit, request a non-urgent call back, or ask for a work or school excuse for 24 hours related to this e-Visit. If it has been greater than 24 hours you will need to follow up with your provider, or enter a new e-Visit to address those concerns.  You will get an e-mail in the next two days asking about your experience.  I hope that your e-visit has been valuable and will speed your recovery. Thank you for using e-visits.  Greater than 5 minutes, yet less than 10 minutes of time have been spent researching, coordinating and implementing care for this patient today.

## 2020-07-25 ENCOUNTER — Other Ambulatory Visit: Payer: Self-pay

## 2020-07-25 ENCOUNTER — Other Ambulatory Visit (HOSPITAL_COMMUNITY): Payer: Self-pay | Admitting: Family Medicine

## 2020-07-25 ENCOUNTER — Ambulatory Visit (HOSPITAL_COMMUNITY)
Admission: RE | Admit: 2020-07-25 | Discharge: 2020-07-25 | Disposition: A | Discharge status: Home / Self Care | Payer: PRIVATE HEALTH INSURANCE | Source: Ambulatory Visit | Attending: Family Medicine | Admitting: Family Medicine

## 2020-07-25 DIAGNOSIS — Z1231 Encounter for screening mammogram for malignant neoplasm of breast: Secondary | ICD-10-CM

## 2020-07-31 ENCOUNTER — Telehealth: Payer: PRIVATE HEALTH INSURANCE | Admitting: Physician Assistant

## 2020-07-31 DIAGNOSIS — M549 Dorsalgia, unspecified: Secondary | ICD-10-CM | POA: Diagnosis not present

## 2020-07-31 MED ORDER — CYCLOBENZAPRINE HCL 10 MG PO TABS: 10.0000 mg | ORAL_TABLET | Freq: Three times a day (TID) | ORAL | 0 refills | Status: DC | PRN

## 2020-07-31 MED ORDER — NAPROXEN 500 MG PO TABS: 500.0000 mg | ORAL_TABLET | Freq: Two times a day (BID) | ORAL | 0 refills | Status: DC

## 2020-07-31 NOTE — Progress Notes (Signed)
I have spent 5 minutes in review of e-visit questionnaire, review and updating patient chart, medical decision making and response to patient.   Envi Eagleson Cody Tyna Huertas, PA-C    

## 2020-07-31 NOTE — Progress Notes (Signed)

## 2020-08-01 ENCOUNTER — Other Ambulatory Visit: Payer: Self-pay

## 2020-08-01 ENCOUNTER — Ambulatory Visit (INDEPENDENT_AMBULATORY_CARE_PROVIDER_SITE_OTHER): Payer: PRIVATE HEALTH INSURANCE | Admitting: Nurse Practitioner

## 2020-08-01 ENCOUNTER — Encounter: Payer: Self-pay | Admitting: Nurse Practitioner

## 2020-08-01 VITALS — BP 118/72 | HR 98 | Temp 98.2°F | Ht 65.75 in | Wt 125.2 lb

## 2020-08-01 DIAGNOSIS — F419 Anxiety disorder, unspecified: Secondary | ICD-10-CM | POA: Diagnosis not present

## 2020-08-01 DIAGNOSIS — G4700 Insomnia, unspecified: Secondary | ICD-10-CM

## 2020-08-01 MED ORDER — SERTRALINE HCL 50 MG PO TABS: ORAL_TABLET | ORAL | 0 refills | Status: DC

## 2020-08-01 NOTE — Progress Notes (Signed)
   Subjective:    Patient ID: Shirley Murray, female    DOB: 1970-09-08, 50 y.o.   MRN: [id number]  HPI  Patient arrives to discuss possible ADHD. Having some difficulty focusing.  Admits to increased stress.  Having some personal issues.  Also today more upset because she recently broke up with her boyfriend.  Does get regular counseling.  States her mind races "24/7".  Significant difficulty sleeping.  Has a history of anxiety and OCD.  Has lost weight recently, patient states this is intentional.  Walks about 6 miles per day.  Denies any suicidal or homicidal thoughts or ideation.  Takes a rare Klonopin only for extreme anxiety.  Review of Systems     Objective:   Physical Exam NAD.  Alert, oriented.  Mildly anxious affect.  Fatigued in appearance.  Making good eye contact.  Dressed appropriately.  Thoughts logical coherent and relevant.  Lungs clear.  Heart regular rate rhythm.       Assessment & Plan:   Problem List Items Addressed This Visit      Other   Anxiety - Primary   Relevant Medications   sertraline (ZOLOFT) 50 MG tablet   Insomnia     Meds ordered this encounter  Medications  . sertraline (ZOLOFT) 50 MG tablet    Sig: Take 1/2 tab po qd x 6 d then one po qd    Dispense:  30 tablet    Refill:  0    Order Specific Question:   Supervising Provider    Answer:   Sallee Lange A [[id number]]   Recommend that she take Klonopin as directed for sleep. Discussed options at length for anxiety, patient agrees to a trial of sertraline starting low-dose with slow titration.  Reviewed potential adverse effects.  Patient to discontinue medication and contact office if any significant problems. Continue regular activity and counseling. Feel at this time that anxiety is the main contributor to her difficulty focusing and remembering things.  Recommend the patient get her anxiety under control first and then we can revisit possibility of ADHD at that time. Return in about 1 month  (around 09/01/2020) for physical.

## 2020-08-02 ENCOUNTER — Encounter: Payer: Self-pay | Admitting: Nurse Practitioner

## 2020-08-21 ENCOUNTER — Other Ambulatory Visit (HOSPITAL_BASED_OUTPATIENT_CLINIC_OR_DEPARTMENT_OTHER): Payer: Self-pay

## 2020-08-25 ENCOUNTER — Other Ambulatory Visit: Payer: Self-pay | Admitting: Nurse Practitioner

## 2020-08-29 ENCOUNTER — Ambulatory Visit: Payer: PRIVATE HEALTH INSURANCE | Admitting: Nurse Practitioner

## 2020-08-29 ENCOUNTER — Other Ambulatory Visit: Payer: Self-pay

## 2020-08-29 VITALS — BP 114/71 | HR 67 | Temp 97.4°F | Ht 65.75 in | Wt 125.0 lb

## 2020-08-29 DIAGNOSIS — Z13220 Encounter for screening for lipoid disorders: Secondary | ICD-10-CM | POA: Diagnosis not present

## 2020-08-29 DIAGNOSIS — Z01419 Encounter for gynecological examination (general) (routine) without abnormal findings: Secondary | ICD-10-CM

## 2020-08-29 DIAGNOSIS — Z1159 Encounter for screening for other viral diseases: Secondary | ICD-10-CM | POA: Diagnosis not present

## 2020-08-29 DIAGNOSIS — Z0000 Encounter for general adult medical examination without abnormal findings: Secondary | ICD-10-CM

## 2020-08-29 MED ORDER — FEXOFENADINE-PSEUDOEPHED ER 180-240 MG PO TB24: ORAL_TABLET | ORAL | 2 refills | Status: DC

## 2020-08-29 NOTE — Progress Notes (Signed)
Subjective:    Patient ID: Shirley Murray, female    DOB: 08-25-1970, 50 y.o.   MRN: [id number]  HPI Patient presents today for wellness exam.  She exercises every day; walks 6 miles a day. Eats more than 5 servings of fruit and vegetables.  Enjoys hummus and cucumbers as her favorite snack.  Weight is consistent.  Needs referral for colonoscopy.  Up-to-date on mammograms, eye exams, dental exams, skin checks.  Hx of hysterectomy.  She has been divorced for a little while and has had a boyfriend for over a year; same sexual partner during this time.  Denies concern for STI.   She did try zoloft a month ago for anxiety, but felt that it made her anxiety worse, bordering on suicidal thoughts, as well as intolerable GI effects.  Denies suicidal or homicidal ideation since then. She is back to what she considers her baseline for anxiety.  She sees a Social worker for the anxiety and feels this works well.  She does have trouble sleeping.  She has klonopin at home and has tried melatonin and feels neither works very well. States the trouble she has sleeping is more related to being a very active person than being anxious, and she has trouble winding down at night.   Patient requests prescription for allegra-D 24h for chronic seasonal allergies. Regular vision and dental exams.  PSH: vag hyst with bilat salpingectomy  Review of Systems  Constitutional: Negative for fever.  HENT:       C/o chronic allergies  Respiratory: Negative for cough, chest tightness, shortness of breath and wheezing.   Cardiovascular: Negative for chest pain, palpitations and leg swelling.  Gastrointestinal: Positive for constipation. Negative for abdominal pain, blood in stool, diarrhea, nausea and vomiting.       Mild chronic constipation managed at home  Genitourinary: Negative for difficulty urinating, dysuria, enuresis, frequency, genital sores, hematuria, pelvic pain, vaginal bleeding, vaginal discharge and vaginal pain.   Neurological:       Chronic migraines, approx 2x a month managed with ibuprofen and nausea meds as needed.   Psychiatric/Behavioral: Negative for agitation, self-injury and suicidal ideas.       Chronic anxiety and difficulty sleeping, chronic, no changes from baseline   GAD 7 : Generalized Anxiety Score 08/29/2020 09/04/2019 05/18/2017  Nervous, Anxious, on Edge 0 0 3  Control/stop worrying 0 1 1  Worry too much - different things 0 1 1  Trouble relaxing 1 1 1   Restless 1 0 1  Easily annoyed or irritable 0 0 0  Afraid - awful might happen 0 0 0  Total GAD 7 Score 2 3 7   Anxiety Difficulty Not difficult at all Not difficult at all Not difficult at all        Objective:   Physical Exam Vitals and nursing note reviewed. Exam conducted with a chaperone present.  Constitutional:      General: She is not in acute distress. Neck:     Comments: No enlargement, tenderness, or masses noted on thyroid exam Cardiovascular:     Rate and Rhythm: Normal rate and regular rhythm.     Heart sounds: Normal heart sounds.  Pulmonary:     Effort: Pulmonary effort is normal.     Breath sounds: Normal breath sounds.  Chest:  Breasts:     Right: No swelling, inverted nipple, mass, skin change, tenderness, axillary adenopathy or supraclavicular adenopathy.     Left: No swelling, inverted nipple, mass, skin change, tenderness, axillary  adenopathy or supraclavicular adenopathy.    Abdominal:     Palpations: Abdomen is soft. There is no mass.     Tenderness: There is no abdominal tenderness.  Genitourinary:    Pubic Area: No rash.      Labia:        Right: No rash, tenderness or lesion.        Left: No rash, tenderness or lesion.      Comments: Milky white discharge present.  Vaginal walls moist and pink.  Lymphadenopathy:     Upper Body:     Right upper body: No supraclavicular, axillary or pectoral adenopathy.     Left upper body: No supraclavicular, axillary or pectoral adenopathy.   Neurological:     Mental Status: She is alert.  Psychiatric:        Mood and Affect: Mood normal.        Behavior: Behavior normal.        Thought Content: Thought content normal.        Judgment: Judgment normal.     Comments: Cheerful affect     Vitals:   08/29/20 1059  BP: 114/71  Pulse: 67  Temp: (!) 97.4 F (36.3 C)  SpO2: 97%          Assessment & Plan:   Problem List Items Addressed This Visit   None   Visit Diagnoses    Screening for hyperlipidemia    -  Primary   Routine general medical examination at a health care facility       Relevant Orders   Lipid panel   Comprehensive metabolic panel   Lipid screening       Relevant Orders   Lipid panel   Comprehensive metabolic panel   Need for hepatitis C screening test       Relevant Orders   Hepatitis C antibody        Meds ordered this encounter  Medications  . fexofenadine-pseudoephedrine (ALLEGRA-D ALLERGY & CONGESTION) 180-240 MG 24 hr tablet    Sig: Take 1 PO QD PRN for allergies    Dispense:  30 tablet    Refill:  2    Order Specific Question:   Supervising Provider    Answer:   Sallee Lange A [[id number]]   If she feels the need to change how she manages her anxiety she knows we are available to help.  Will review labs with her when they are available.   Caution about Allegra D due to potential insomnia and palpitations.   Follow-up for physical in 1 year.

## 2020-08-29 NOTE — Progress Notes (Signed)
   Subjective:    Patient ID: Shirley Murray, female    DOB: [birth year], 50 y.o.   MRN: [id number]  HPI  Annual physical exam with no pap , refill clonazepam and claritin D Review of Systems     Objective:   Physical Exam        Assessment & Plan:

## 2020-08-30 ENCOUNTER — Encounter: Payer: Self-pay | Admitting: Nurse Practitioner

## 2020-08-30 LAB — COMPREHENSIVE METABOLIC PANEL
ALT: 19 IU/L (ref 0–32)
AST: 17 IU/L (ref 0–40)
Albumin/Globulin Ratio: 2.1 (ref 1.2–2.2)
Albumin: 4.5 g/dL (ref 3.8–4.8)
Alkaline Phosphatase: 70 IU/L (ref 44–121)
BUN/Creatinine Ratio: 15 (ref 9–23)
BUN: 8 mg/dL (ref 6–24)
Bilirubin Total: 1 mg/dL (ref 0.0–1.2)
CO2: 22 mmol/L (ref 20–29)
Calcium: 9.2 mg/dL (ref 8.7–10.2)
Chloride: 104 mmol/L (ref 96–106)
Creatinine, Ser: 0.53 mg/dL — ABNORMAL LOW (ref 0.57–1.00)
Globulin, Total: 2.1 g/dL (ref 1.5–4.5)
Glucose: 83 mg/dL (ref 65–99)
Potassium: 3.9 mmol/L (ref 3.5–5.2)
Sodium: 144 mmol/L (ref 134–144)
Total Protein: 6.6 g/dL (ref 6.0–8.5)
eGFR: 113 mL/min/{1.73_m2} (ref >59)

## 2020-08-30 LAB — LIPID PANEL
Chol/HDL Ratio: 3 ratio (ref 0.0–4.4)
Cholesterol, Total: 169 mg/dL (ref 100–199)
HDL: 57 mg/dL (ref >39)
LDL Chol Calc (NIH): 99 mg/dL (ref 0–99)
Triglycerides: 65 mg/dL (ref 0–149)
VLDL Cholesterol Cal: 13 mg/dL (ref 5–40)

## 2020-08-30 LAB — HEPATITIS C ANTIBODY: Hep C Virus Ab: <0.1 s/co ratio (ref 0.0–0.9)

## 2020-09-10 ENCOUNTER — Other Ambulatory Visit: Payer: Self-pay | Admitting: Family Medicine

## 2020-09-10 ENCOUNTER — Encounter: Payer: Self-pay | Admitting: Nurse Practitioner

## 2020-09-10 ENCOUNTER — Other Ambulatory Visit: Payer: Self-pay | Admitting: Nurse Practitioner

## 2020-09-10 DIAGNOSIS — Z1211 Encounter for screening for malignant neoplasm of colon: Secondary | ICD-10-CM

## 2020-09-10 MED ORDER — CLONAZEPAM 0.5 MG PO TABS
ORAL_TABLET | ORAL | 2 refills | Status: DC
  Filled 2020-09-10: qty 45, fill #0

## 2020-09-11 ENCOUNTER — Other Ambulatory Visit (HOSPITAL_COMMUNITY): Payer: Self-pay

## 2020-09-16 ENCOUNTER — Encounter: Payer: Self-pay | Admitting: Internal Medicine

## 2020-09-30 ENCOUNTER — Encounter: Payer: Self-pay | Admitting: Gastroenterology

## 2020-10-09 ENCOUNTER — Encounter: Payer: Self-pay | Admitting: Nurse Practitioner

## 2020-10-10 ENCOUNTER — Other Ambulatory Visit: Payer: Self-pay | Admitting: Nurse Practitioner

## 2020-10-10 MED ORDER — MINOCYCLINE HCL 50 MG PO CAPS: 50.0000 mg | ORAL_CAPSULE | Freq: Two times a day (BID) | ORAL | 2 refills | Status: DC

## 2020-11-28 ENCOUNTER — Ambulatory Visit: Payer: PRIVATE HEALTH INSURANCE | Admitting: Gastroenterology

## 2020-11-30 ENCOUNTER — Other Ambulatory Visit: Payer: Self-pay

## 2020-11-30 ENCOUNTER — Ambulatory Visit (INDEPENDENT_AMBULATORY_CARE_PROVIDER_SITE_OTHER): Payer: No Typology Code available for payment source

## 2020-11-30 ENCOUNTER — Ambulatory Visit
Admission: EM | Admit: 2020-11-30 | Discharge: 2020-11-30 | Disposition: A | Discharge status: Home / Self Care | Payer: No Typology Code available for payment source | Attending: Family Medicine | Admitting: Family Medicine

## 2020-11-30 DIAGNOSIS — W19XXXA Unspecified fall, initial encounter: Secondary | ICD-10-CM

## 2020-11-30 DIAGNOSIS — S83421A Sprain of lateral collateral ligament of right knee, initial encounter: Secondary | ICD-10-CM

## 2020-11-30 DIAGNOSIS — S83422A Sprain of lateral collateral ligament of left knee, initial encounter: Secondary | ICD-10-CM | POA: Diagnosis not present

## 2020-11-30 DIAGNOSIS — M25561 Pain in right knee: Secondary | ICD-10-CM | POA: Diagnosis not present

## 2020-11-30 DIAGNOSIS — I959 Hypotension, unspecified: Secondary | ICD-10-CM

## 2020-11-30 MED ORDER — KETOROLAC TROMETHAMINE 60 MG/2ML IM SOLN
60.0000 mg | Freq: Once | INTRAMUSCULAR | Status: AC
  Administered 2020-11-30: 60 mg via INTRAMUSCULAR

## 2020-11-30 MED ORDER — TIZANIDINE HCL 4 MG PO TABS: 4.0000 mg | ORAL_TABLET | Freq: Every day | ORAL | 0 refills | Status: DC

## 2020-11-30 MED ORDER — KETOROLAC TROMETHAMINE 10 MG PO TABS: 10.0000 mg | ORAL_TABLET | Freq: Four times a day (QID) | ORAL | 0 refills | Status: DC | PRN

## 2020-11-30 NOTE — ED Triage Notes (Signed)
Patient presents to Urgent Care with complaints of a fall yesterday injuring her right knee. Increased pain with movement. Treating pain with ibuprofen.

## 2020-11-30 NOTE — ED Provider Notes (Addendum)
[facility]    CSN: [id number] Arrival date & time: 11/30/20  1306      History   Chief Complaint Chief Complaint  Patient presents with   Knee Pain    Right knee     HPI Shirley Murray is a 50 y.o. female.   HPI Patient reports falling down her outdoor steps on yesterday.  Patient has a history of low blood pressure and dizziness and is uncertain if her blood pressure had dropped as she had just finished a routine walk.  She recalls falling uncertain if she twisted the right extremity.  She has diffuse swelling of the lateral ligament along with pain with hyperextension and flexion and weightbearing.  No history of arthritis or prior injuries involving the right knee.   Past Medical History:  Diagnosis Date   Allergy    codeine   Anxiety    Breast disorder    fibrocystic breast    Headache(784.0)    Migraines   History of kidney stones    Mental disorder    ocd   OCD (obsessive compulsive disorder)     Patient Active Problem List   Diagnosis Date Noted   Post-operative state 12/28/2016   Chronic migraine without aura, intractable, with status migrainosus 03/23/2016   Muscle spasms of neck 09/03/2014   Migraine with aura 07/02/2014   Insomnia 03/13/2013   Anxiety 06/15/2011   OCD (obsessive compulsive disorder) 06/15/2011   Migraine headache without aura 01/26/2011    Past Surgical History:  Procedure Laterality Date   birth mark removed from left arm  [place] N/A 12/28/2016   Procedure: TRANSVAGINAL TAPE (TVT) PROCEDURE;  Surgeon: Emily Filbert, MD;  Location: [facility];  Service: Gynecology;  Laterality: N/A;   BREAST EXCISIONAL BIOPSY Right    benign   cold knife conization     cyst removed from right breast  1999   CYSTOSCOPY N/A 12/28/2016   Procedure: CYSTOSCOPY;  Surgeon: Emily Filbert, MD;  Location: [facility];  Service: Gynecology;  Laterality: N/A;   ENDOMETRIAL ABLATION     VAGINAL HYSTERECTOMY Bilateral 12/28/2016    Procedure: HYSTERECTOMY VAGINAL WITH BILATERAL SALPINGECTOMY;  Surgeon: Emily Filbert, MD;  Location: [facility];  Service: Gynecology;  Laterality: Bilateral;    OB History     Gravida  4   Para  3   Term  3   Preterm  0   AB  1   Living  3      SAB  1   IAB  0   Ectopic  0   Multiple  0   Live Births  3            Home Medications    Prior to Admission medications   Medication Sig Start Date End Date Taking? Authorizing Provider  clonazePAM (KLONOPIN) 0.5 MG tablet TAKE 1/2 TABLET BY MOUTH DURING THE DAY AND 1 TABLET EVERY NIGHT AT BEDTIME AS NEEDED 09/10/20 03/09/21  Nilda Simmer, NP  fexofenadine-pseudoephedrine (ALLEGRA-D ALLERGY & CONGESTION) 180-240 MG 24 hr tablet Take 1 PO QD PRN for allergies 08/29/20   Nilda Simmer, NP  fluticasone (FLONASE) 50 MCG/ACT nasal spray Place 2 sprays into both nostrils daily.    [provider]  minocycline (MINOCIN) 50 MG capsule Take 1 capsule (50 mg total) by mouth 2 (two) times daily. Prn acne 10/10/20   Nilda Simmer, NP  Multiple Vitamins-Minerals (ZINC PO) Take by mouth.  [provider]  ondansetron (ZOFRAN) 4 MG tablet TAKE 1 TABLET BY MOUTH EVERY 8 HOURS AS NEEDED FOR NAUSEA OR VOMITING 12/07/19 12/06/20  Kathyrn Drown, MD  promethazine (PHENERGAN) 25 MG tablet TAKE 1 TABLET BY MOUTH EVERY 6 HOURS AS NEEDED 12/07/19 12/06/20  Kathyrn Drown, MD  VITAMIN D PO Take by mouth.    [provider]    Family History Family History  Problem Relation Age of Onset   Cancer Paternal Grandfather        bone   Migraines Paternal Grandfather    Hypertension Paternal Grandmother    Cancer Paternal Grandmother        colon/deceased   Stroke Maternal Grandmother    Diabetes Maternal Grandmother        adult onset   Parkinsonism Maternal Grandmother    Cancer Maternal Grandfather        oral   Hypertension Maternal Grandfather    Diabetes Maternal Grandfather        adult onset    Hypertension Father    Mental illness Father        bipolar   Cancer Mother 34       uterine cancer   Colon cancer Paternal Aunt 52   Cancer Paternal Aunt        colon cancer   Cancer Paternal Uncle        colon cancer    Social History Social History   Tobacco Use   Smoking status: Never   Smokeless tobacco: Never  Vaping Use   Vaping Use: Never used  Substance Use Topics   Alcohol use: Yes    Comment: socially   Drug use: No     Allergies   Codeine, Maxalt [rizatriptan], Zoloft [sertraline hcl], Augmentin [amoxicillin-pot clavulanate], Dilaudid [hydromorphone], Imitrex [sumatriptan], Mucinex [guaifenesin er], and Relpax [eletriptan]   Review of Systems Review of Systems   Physical Exam Triage Vital Signs ED Triage Vitals  Enc Vitals Group     BP 11/30/20 1401 (!) 94/58     Pulse Rate 11/30/20 1401 87     Resp 11/30/20 1401 16     Temp 11/30/20 1401 98.4 F (36.9 C)     Temp Source 11/30/20 1401 Oral     SpO2 11/30/20 1401 97 %     Weight --      Height --      Head Circumference --      Peak Flow --      Pain Score 11/30/20 1403 6     Pain Loc --      Pain Edu? --      Excl. in [place]? --    No data found.  Updated Vital Signs BP (!) 94/58 (BP Location: Right Arm)   Pulse 87   Temp 98.4 F (36.9 C) (Oral)   Resp 16   LMP 12/19/2016 (Exact Date)   SpO2 97%   Visual Acuity Right Eye Distance:   Left Eye Distance:   Bilateral Distance:    Right Eye Near:   Left Eye Near:    Bilateral Near:     Physical Exam   UC Treatments / Results  Labs (all labs ordered are listed, but only abnormal results are displayed) Labs Reviewed - No data to display  EKG   Radiology DG Knee Complete 4 Views Right  Result Date: 11/30/2020 CLINICAL DATA:  [place] yesterday. Injured the RIGHT knee. Pain with movement. EXAM: RIGHT KNEE - COMPLETE 4+ VIEW COMPARISON:  None. FINDINGS: No evidence of fracture, dislocation, or joint effusion. No evidence of arthropathy  or other focal bone abnormality. Soft tissues are unremarkable. IMPRESSION: Negative. Electronically Signed   By: Nolon Nations M.D.   On: 11/30/2020 14:14    Procedures Procedures (including critical care time)  Medications Ordered in UC Medications  ketorolac (TORADOL) injection 60 mg (has no administration in time range)    Initial Impression / Assessment and Plan / UC Course  I have reviewed the triage vital signs and the nursing notes.  Pertinent labs & imaging results that were available during my care of the patient were reviewed by me and considered in my medical decision making (see chart for details).    Suspect right knee ligament sprain injury related to fall.  Plain films negative for any acute fracture or joint effusion.  Patient will follow up with orthopedic on 7/5 as tomorrow is a holiday. Hypotension of unknown etiology patient is working with her PCP regarding this matter.  Blood pressure is soft here today although patient is asymptomatic.  Toradol IM given here in clinic for pain.  Patient will continue with Toradol 10 mg every 6 hours as needed for pain.  Tizanidine prescribed as needed for acute pain.  Patient will remain in knee brace with all weightbearing activities until seen by Orthopedics  Final Clinical Impressions(s) / UC Diagnoses   Final diagnoses:  Hypotension, unspecified hypotension type  Sprain of lateral collateral ligament of left knee, initial encounter   Discharge Instructions   None    ED Prescriptions     Medication Sig Dispense Auth. Provider   ketorolac (TORADOL) 10 MG tablet Take 1 tablet (10 mg total) by mouth every 6 (six) hours as needed. 20 tablet Scot Jun, FNP   tiZANidine (ZANAFLEX) 4 MG tablet Take 1 tablet (4 mg total) by mouth at bedtime. 20 tablet Scot Jun, FNP      PDMP not reviewed this encounter.   Scot Jun, FNP 11/30/20 1504    Scot Jun, FNP 12/08/20 1205

## 2020-12-17 ENCOUNTER — Other Ambulatory Visit: Payer: Self-pay | Admitting: Family Medicine

## 2021-01-05 ENCOUNTER — Ambulatory Visit: Payer: PRIVATE HEALTH INSURANCE | Admitting: Gastroenterology

## 2021-01-22 NOTE — Progress Notes (Signed)
Referring Provider:Hoskins, Katharine Look, NP Primary Care Physician:  Kathyrn Drown, MD Primary Gastroenterologist:  Dr. Gala Romney  Chief Complaint  Patient presents with   Colonoscopy    Never had tcs. [facility].   Constipation    HPI:   Shirley Murray is a 50 y.o. female presenting today at the request of Nilda Simmer, NP to discuss scheduling screening colonoscopy.  States she is doing very well overall.  Reports chronic constipation using MiraLAX PRN as other over-the-counter products cause gas/bloating.  BMs are about once a week, using MiraLAX once every 2 weeks.  Scant toilet tissue hematochezia a total of 5 times over the last 6 months. Has a hemorrhoid since giving birth that flares up every now and then. Will use preparation H as needed. No black stools. No abdominal pain. No upper GI symptoms such as nausea, vomiting, reflux symptoms, or dysphagia.  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   Past Medical History:  Diagnosis Date   Allergy    codeine   Anxiety    Breast disorder    fibrocystic breast    Colon cancer (HCC)    Headache(784.0)    Migraines   History of kidney stones    Mental disorder    ocd   OCD (obsessive compulsive disorder)     Past Surgical History:  Procedure Laterality Date   birth mark removed from left arm  1974   BLADDER SUSPENSION N/A 12/28/2016   Procedure: TRANSVAGINAL TAPE (TVT) PROCEDURE;  Surgeon: Emily Filbert, MD;  Location: [facility];  Service: Gynecology;  Laterality: N/A;   BREAST EXCISIONAL BIOPSY Right    benign   cold knife conization     cyst removed from right breast  1999   CYSTOSCOPY N/A 12/28/2016   Procedure: CYSTOSCOPY;  Surgeon: Emily Filbert, MD;  Location: [facility];  Service: Gynecology;  Laterality: N/A;   ENDOMETRIAL ABLATION     VAGINAL HYSTERECTOMY Bilateral 12/28/2016   Procedure: HYSTERECTOMY VAGINAL WITH BILATERAL SALPINGECTOMY;  Surgeon: Emily Filbert, MD;  Location: [facility];  Service: Gynecology;   Laterality: Bilateral;    Current Outpatient Medications  Medication Sig Dispense Refill   cetirizine (ZYRTEC) 10 MG tablet Take 10 mg by mouth daily.     clonazePAM (KLONOPIN) 0.5 MG tablet TAKE 1/2 TABLET BY MOUTH DURING THE DAY AND 1 TABLET EVERY NIGHT AT BEDTIME AS NEEDED (Patient taking differently: as needed.) 45 tablet 2   fluticasone (FLONASE) 50 MCG/ACT nasal spray Place 2 sprays into both nostrils daily.     minocycline (MINOCIN) 50 MG capsule Take 1 capsule (50 mg total) by mouth 2 (two) times daily. Prn acne 60 capsule 2   Multiple Vitamins-Minerals (ZINC PO) Take by mouth daily.     ondansetron (ZOFRAN) 4 MG tablet Take 4 mg by mouth as needed for nausea or vomiting.     polyethylene glycol (MIRALAX / GLYCOLAX) 17 g packet Take 17 g by mouth daily as needed.     promethazine (PHENERGAN) 25 MG tablet TAKE 1 TABLET BY MOUTH EVERY 6 HOURS AS NEEDED 30 tablet 2   Rimegepant Sulfate (NURTEC) 75 MG TBDP Take by mouth.     VITAMIN D PO Take by mouth daily.     No current facility-administered medications for this visit.    Allergies as of 01/23/2021 - Review Complete 01/23/2021  Allergen Reaction Noted   Codeine Rash 01/11/2011   Maxalt [rizatriptan] Hives and Swelling 04/10/2012  Zoloft [sertraline hcl] Other (See Comments) 08/30/2020   Augmentin [amoxicillin-pot clavulanate] Itching and Rash 12/16/2016   Dilaudid [hydromorphone] Itching and Rash 12/16/2016   Imitrex [sumatriptan] Rash 09/25/2013   Morphine and related Rash 01/23/2021   Mucinex [guaifenesin er] Rash 12/20/2016   Relpax [eletriptan] Rash 09/25/2013    Family History  Problem Relation Age of Onset   Cancer Mother 65       uterine cancer   Hypertension Father    Mental illness Father        bipolar   Stroke Maternal Grandmother    Diabetes Maternal Grandmother        adult onset   Parkinsonism Maternal Grandmother    Cancer Maternal Grandfather        oral   Hypertension Maternal Grandfather     Diabetes Maternal Grandfather        adult onset   Hypertension Paternal Grandmother    Cancer Paternal Grandmother        colon/deceased; 66s   Cancer Paternal Grandfather        bone   Migraines Paternal Grandfather    Colon cancer Paternal Aunt 48   Cancer Paternal Aunt 76       colon cancer   Cancer Paternal Uncle 51       colon cancer    Social History   Socioeconomic History   Marital status: Divorced    Spouse name: Not on file   Number of children: 3   Years of education: 67   Highest education level: Not on file  Occupational History   Occupation: [employer]   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  Tobacco Use   Smoking status: Never   Smokeless tobacco: Never  Vaping Use   Vaping Use: Never used  Substance and Sexual Activity   Alcohol use: Yes    Comment: socially   Drug use: No   Sexual activity: Not Currently    Partners: Male    Birth control/protection: Surgical    Comment: hyst  Other Topics Concern   Not on file  Social History Narrative   Lives w/ spouse   Caffeine use: 8 ounces per day   Social Determinants of Health   Financial Resource Strain: Not on file  Food Insecurity: Not on file  Transportation Needs: Not on file  Physical Activity: Not on file  Stress: Not on file  Social Connections: Not on file  Intimate Partner Violence: Not on file    Review of Systems: Gen: Denies any fever, chills, cold or flulike symptoms, presyncope, syncope. CV: Denies chest pain, heart palpitations Resp: Denies shortness of breath or cough. GI: See HPI GU : Denies urinary burning, urinary frequency, urinary hesitancy MS: Denies joint pain Derm: Denies rash Psych: Admits to anxiety. Heme: See HPI  Physical Exam: BP 105/66   Pulse 66   Temp 97.7 F (36.5 C) (Temporal)   Ht '5\' 6"'$  (1.676 m)   Wt 122 lb 6.4 oz (55.5 kg)   LMP 12/19/2016 (Exact Date)   BMI 19.76 kg/m  General:   Alert and  oriented. Pleasant and cooperative. Well-nourished and well-developed.  Head:  Normocephalic and atraumatic. Eyes:  Without icterus, sclera clear and conjunctiva pink.  Ears:  Normal auditory acuity. Lungs:  Clear to auscultation bilaterally. No wheezes, rales, or rhonchi. No distress.  Heart:  S1, S2 present without murmurs appreciated.  Abdomen:  +BS, soft, non-tender and non-distended. No HSM noted. No  guarding or rebound. No masses appreciated.  Rectal:  Deferred  Msk:  Symmetrical without gross deformities. Normal posture. Extremities:  Without edema. Neurologic:  Alert and  oriented x4;  grossly normal neurologically. Skin:  Intact without significant lesions or rashes. Psych: Normal mood and affect.    Assessment:  50 year old female with history of migraines, anxiety, and several family members with history of colon cancer presenting today to schedule first-ever screening colonoscopy.  She reports chronic history of constipation using MiraLAX sparingly, typically with BMs once a week.  Occasional scant toilet tissue hematochezia in the setting of known hemorrhoids after childbirth.  Hemorrhoid symptoms are well managed with Preparation H as needed. Denies melena, unintentional weight loss, or any other significant GI symptoms.   Plan: 1.  Proceed with colonoscopy with propofol with Dr. Abbey Chatters in the near future. The risks, benefits, and alternatives have been discussed with the patient in detail. The patient states understanding and desires to proceed.  ASA II 2.  Start MiraLAX daily to every other day for constipation.  May adjust frequency as needed. 3.  Follow-up as needed.    Aliene Altes, PA-C [facility] 01/23/2021

## 2021-01-22 NOTE — H&P (View-Only) (Signed)
Referring Provider:Hoskins, Katharine Look, NP Primary Care Physician:  Kathyrn Drown, MD Primary Gastroenterologist:  Dr. Gala Romney  Chief Complaint  Patient presents with   Colonoscopy    Never had tcs. [facility].   Constipation    HPI:   Shirley Murray is a 50 y.o. female presenting today at the request of Nilda Simmer, NP to discuss scheduling screening colonoscopy.  States she is doing very well overall.  Reports chronic constipation using MiraLAX PRN as other over-the-counter products cause gas/bloating.  BMs are about once a week, using MiraLAX once every 2 weeks.  Scant toilet tissue hematochezia a total of 5 times over the last 6 months. Has a hemorrhoid since giving birth that flares up every now and then. Will use preparation H as needed. No black stools. No abdominal pain. No upper GI symptoms such as nausea, vomiting, reflux symptoms, or dysphagia.  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   Past Medical History:  Diagnosis Date   Allergy    codeine   Anxiety    Breast disorder    fibrocystic breast    Colon cancer (HCC)    Headache(784.0)    Migraines   History of kidney stones    Mental disorder    ocd   OCD (obsessive compulsive disorder)     Past Surgical History:  Procedure Laterality Date   birth mark removed from left arm  1974   BLADDER SUSPENSION N/A 12/28/2016   Procedure: TRANSVAGINAL TAPE (TVT) PROCEDURE;  Surgeon: Emily Filbert, MD;  Location: [facility];  Service: Gynecology;  Laterality: N/A;   BREAST EXCISIONAL BIOPSY Right    benign   cold knife conization     cyst removed from right breast  1999   CYSTOSCOPY N/A 12/28/2016   Procedure: CYSTOSCOPY;  Surgeon: Emily Filbert, MD;  Location: [facility];  Service: Gynecology;  Laterality: N/A;   ENDOMETRIAL ABLATION     VAGINAL HYSTERECTOMY Bilateral 12/28/2016   Procedure: HYSTERECTOMY VAGINAL WITH BILATERAL SALPINGECTOMY;  Surgeon: Emily Filbert, MD;  Location: [facility];  Service: Gynecology;   Laterality: Bilateral;    Current Outpatient Medications  Medication Sig Dispense Refill   cetirizine (ZYRTEC) 10 MG tablet Take 10 mg by mouth daily.     clonazePAM (KLONOPIN) 0.5 MG tablet TAKE 1/2 TABLET BY MOUTH DURING THE DAY AND 1 TABLET EVERY NIGHT AT BEDTIME AS NEEDED (Patient taking differently: as needed.) 45 tablet 2   fluticasone (FLONASE) 50 MCG/ACT nasal spray Place 2 sprays into both nostrils daily.     minocycline (MINOCIN) 50 MG capsule Take 1 capsule (50 mg total) by mouth 2 (two) times daily. Prn acne 60 capsule 2   Multiple Vitamins-Minerals (ZINC PO) Take by mouth daily.     ondansetron (ZOFRAN) 4 MG tablet Take 4 mg by mouth as needed for nausea or vomiting.     polyethylene glycol (MIRALAX / GLYCOLAX) 17 g packet Take 17 g by mouth daily as needed.     promethazine (PHENERGAN) 25 MG tablet TAKE 1 TABLET BY MOUTH EVERY 6 HOURS AS NEEDED 30 tablet 2   Rimegepant Sulfate (NURTEC) 75 MG TBDP Take by mouth.     VITAMIN D PO Take by mouth daily.     No current facility-administered medications for this visit.    Allergies as of 01/23/2021 - Review Complete 01/23/2021  Allergen Reaction Noted   Codeine Rash 01/11/2011   Maxalt [rizatriptan] Hives and Swelling 04/10/2012  Zoloft [sertraline hcl] Other (See Comments) 08/30/2020   Augmentin [amoxicillin-pot clavulanate] Itching and Rash 12/16/2016   Dilaudid [hydromorphone] Itching and Rash 12/16/2016   Imitrex [sumatriptan] Rash 09/25/2013   Morphine and related Rash 01/23/2021   Mucinex [guaifenesin er] Rash 12/20/2016   Relpax [eletriptan] Rash 09/25/2013    Family History  Problem Relation Age of Onset   Cancer Mother 61       uterine cancer   Hypertension Father    Mental illness Father        bipolar   Stroke Maternal Grandmother    Diabetes Maternal Grandmother        adult onset   Parkinsonism Maternal Grandmother    Cancer Maternal Grandfather        oral   Hypertension Maternal Grandfather     Diabetes Maternal Grandfather        adult onset   Hypertension Paternal Grandmother    Cancer Paternal Grandmother        colon/deceased; 11s   Cancer Paternal Grandfather        bone   Migraines Paternal Grandfather    Colon cancer Paternal Aunt 46   Cancer Paternal Aunt 4       colon cancer   Cancer Paternal Uncle 41       colon cancer    Social History   Socioeconomic History   Marital status: Divorced    Spouse name: Not on file   Number of children: 3   Years of education: 65   Highest education level: Not on file  Occupational History   Occupation: [employer]   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  Tobacco Use   Smoking status: Never   Smokeless tobacco: Never  Vaping Use   Vaping Use: Never used  Substance and Sexual Activity   Alcohol use: Yes    Comment: socially   Drug use: No   Sexual activity: Not Currently    Partners: Male    Birth control/protection: Surgical    Comment: hyst  Other Topics Concern   Not on file  Social History Narrative   Lives w/ spouse   Caffeine use: 8 ounces per day   Social Determinants of Health   Financial Resource Strain: Not on file  Food Insecurity: Not on file  Transportation Needs: Not on file  Physical Activity: Not on file  Stress: Not on file  Social Connections: Not on file  Intimate Partner Violence: Not on file    Review of Systems: Gen: Denies any fever, chills, cold or flulike symptoms, presyncope, syncope. CV: Denies chest pain, heart palpitations Resp: Denies shortness of breath or cough. GI: See HPI GU : Denies urinary burning, urinary frequency, urinary hesitancy MS: Denies joint pain Derm: Denies rash Psych: Admits to anxiety. Heme: See HPI  Physical Exam: BP 105/66   Pulse 66   Temp 97.7 F (36.5 C) (Temporal)   Ht '5\' 6"'$  (1.676 m)   Wt 122 lb 6.4 oz (55.5 kg)   LMP 12/19/2016 (Exact Date)   BMI 19.76 kg/m  General:   Alert and  oriented. Pleasant and cooperative. Well-nourished and well-developed.  Head:  Normocephalic and atraumatic. Eyes:  Without icterus, sclera clear and conjunctiva pink.  Ears:  Normal auditory acuity. Lungs:  Clear to auscultation bilaterally. No wheezes, rales, or rhonchi. No distress.  Heart:  S1, S2 present without murmurs appreciated.  Abdomen:  +BS, soft, non-tender and non-distended. No HSM noted. No  guarding or rebound. No masses appreciated.  Rectal:  Deferred  Msk:  Symmetrical without gross deformities. Normal posture. Extremities:  Without edema. Neurologic:  Alert and  oriented x4;  grossly normal neurologically. Skin:  Intact without significant lesions or rashes. Psych: Normal mood and affect.    Assessment:  49 year old female with history of migraines, anxiety, and several family members with history of colon cancer presenting today to schedule first-ever screening colonoscopy.  She reports chronic history of constipation using MiraLAX sparingly, typically with BMs once a week.  Occasional scant toilet tissue hematochezia in the setting of known hemorrhoids after childbirth.  Hemorrhoid symptoms are well managed with Preparation H as needed. Denies melena, unintentional weight loss, or any other significant GI symptoms.   Plan: 1.  Proceed with colonoscopy with propofol with Dr. Abbey Chatters in the near future. The risks, benefits, and alternatives have been discussed with the patient in detail. The patient states understanding and desires to proceed.  ASA II 2.  Start MiraLAX daily to every other day for constipation.  May adjust frequency as needed. 3.  Follow-up as needed.    Aliene Altes, PA-C [facility] 01/23/2021

## 2021-01-23 ENCOUNTER — Other Ambulatory Visit: Payer: Self-pay

## 2021-01-23 ENCOUNTER — Telehealth: Payer: Self-pay | Admitting: *Deleted

## 2021-01-23 ENCOUNTER — Encounter: Payer: Self-pay | Admitting: Gastroenterology

## 2021-01-23 ENCOUNTER — Ambulatory Visit: Payer: No Typology Code available for payment source | Admitting: Gastroenterology

## 2021-01-23 ENCOUNTER — Encounter: Payer: Self-pay | Admitting: *Deleted

## 2021-01-23 VITALS — BP 105/66 | HR 66 | Temp 97.7°F | Ht 66.0 in | Wt 122.4 lb

## 2021-01-23 DIAGNOSIS — Z800 Family history of malignant neoplasm of digestive organs: Secondary | ICD-10-CM | POA: Diagnosis not present

## 2021-01-23 DIAGNOSIS — K5900 Constipation, unspecified: Secondary | ICD-10-CM

## 2021-01-23 DIAGNOSIS — Z1211 Encounter for screening for malignant neoplasm of colon: Secondary | ICD-10-CM | POA: Diagnosis not present

## 2021-01-23 NOTE — Patient Instructions (Signed)
We will arrange for you to have a colonoscopy in the near future with Dr. Abbey Chatters.  For constipation, I recommend using MiraLAX 1 capful (17 g) daily to every other day.  You may adjust the frequency as needed if you develop frequent loose stools.  As we discussed, it is important that your bowels are moving well prior to your colonoscopy to ensure that you have a good prep.  It was great meeting you today!  We will follow-up with you as Dr. Abbey Chatters recommends.   Aliene Altes, PA-C [facility]

## 2021-01-23 NOTE — Telephone Encounter (Signed)
Called [facility] and spoke with Greggory Keen. Was advised no PA is required for colonoscopy.

## 2021-01-25 ENCOUNTER — Encounter: Payer: Self-pay | Admitting: Nurse Practitioner

## 2021-01-26 ENCOUNTER — Other Ambulatory Visit: Payer: Self-pay

## 2021-01-27 MED ORDER — MINOCYCLINE HCL 50 MG PO CAPS: 50.0000 mg | ORAL_CAPSULE | Freq: Two times a day (BID) | ORAL | 2 refills | Status: DC

## 2021-01-27 MED ORDER — ONDANSETRON HCL 4 MG PO TABS: 4.0000 mg | ORAL_TABLET | ORAL | 2 refills | Status: DC | PRN

## 2021-01-27 MED ORDER — CLONAZEPAM 0.5 MG PO TABS: ORAL_TABLET | ORAL | 2 refills | Status: DC

## 2021-02-20 ENCOUNTER — Ambulatory Visit (HOSPITAL_COMMUNITY): Payer: PRIVATE HEALTH INSURANCE | Admitting: Certified Registered Nurse Anesthetist

## 2021-02-20 ENCOUNTER — Encounter (HOSPITAL_COMMUNITY)
Admission: RE | Disposition: A | Discharge status: Home / Self Care | Payer: Self-pay | Source: Home / Self Care | Attending: Internal Medicine

## 2021-02-20 ENCOUNTER — Other Ambulatory Visit: Payer: Self-pay

## 2021-02-20 ENCOUNTER — Ambulatory Visit (HOSPITAL_COMMUNITY)
Admission: RE | Admit: 2021-02-20 | Discharge: 2021-02-20 | Disposition: A | Discharge status: Home / Self Care | Payer: PRIVATE HEALTH INSURANCE | Attending: Internal Medicine | Admitting: Internal Medicine

## 2021-02-20 ENCOUNTER — Encounter (HOSPITAL_COMMUNITY): Payer: Self-pay

## 2021-02-20 DIAGNOSIS — D122 Benign neoplasm of ascending colon: Secondary | ICD-10-CM | POA: Diagnosis not present

## 2021-02-20 DIAGNOSIS — K648 Other hemorrhoids: Secondary | ICD-10-CM | POA: Insufficient documentation

## 2021-02-20 DIAGNOSIS — Z79899 Other long term (current) drug therapy: Secondary | ICD-10-CM | POA: Insufficient documentation

## 2021-02-20 DIAGNOSIS — Z800 Family history of malignant neoplasm of digestive organs: Secondary | ICD-10-CM | POA: Insufficient documentation

## 2021-02-20 DIAGNOSIS — Z1211 Encounter for screening for malignant neoplasm of colon: Secondary | ICD-10-CM | POA: Diagnosis not present

## 2021-02-20 DIAGNOSIS — K5730 Diverticulosis of large intestine without perforation or abscess without bleeding: Secondary | ICD-10-CM | POA: Diagnosis not present

## 2021-02-20 DIAGNOSIS — D120 Benign neoplasm of cecum: Secondary | ICD-10-CM | POA: Diagnosis not present

## 2021-02-20 DIAGNOSIS — K5900 Constipation, unspecified: Secondary | ICD-10-CM | POA: Diagnosis not present

## 2021-02-20 HISTORY — PX: COLONOSCOPY WITH PROPOFOL: SHX5780

## 2021-02-20 HISTORY — PX: POLYPECTOMY: SHX5525

## 2021-02-20 HISTORY — PX: BIOPSY: SHX5522

## 2021-02-20 SURGERY — COLONOSCOPY WITH PROPOFOL
Anesthesia: General

## 2021-02-20 MED ORDER — PROPOFOL 500 MG/50ML IV EMUL
INTRAVENOUS | Status: DC | PRN
  Administered 2021-02-20: 150 ug/kg/min via INTRAVENOUS

## 2021-02-20 MED ORDER — LACTATED RINGERS IV SOLN: INTRAVENOUS | Status: DC

## 2021-02-20 MED ORDER — STERILE WATER FOR IRRIGATION IR SOLN
Status: DC | PRN
  Administered 2021-02-20: 200 mL

## 2021-02-20 MED ORDER — PROPOFOL 10 MG/ML IV BOLUS
INTRAVENOUS | Status: DC | PRN
  Administered 2021-02-20: 100 mg via INTRAVENOUS

## 2021-02-20 NOTE — Transfer of Care (Signed)
Immediate Anesthesia Transfer of Care Note  Patient: Shirley Murray  Procedure(s) Performed: COLONOSCOPY WITH PROPOFOL BIOPSY POLYPECTOMY  Patient Location: Endoscopy Unit  Anesthesia Type:General  Level of Consciousness: awake, alert  and oriented  Airway & Oxygen Therapy: Patient Spontanous Breathing  Post-op Assessment: Report given to RN and Post -op Vital signs reviewed and stable  Post vital signs: Reviewed and stable  Last Vitals:  Vitals Value Taken Time  BP    Temp    Pulse    Resp    SpO2      Last Pain:  Vitals:   02/20/21 0813  TempSrc:   PainSc: 0-No pain      Patients Stated Pain Goal: 5 (78/67/54 [id number])  Complications: No notable events documented.

## 2021-02-20 NOTE — Anesthesia Preprocedure Evaluation (Signed)
Anesthesia Evaluation  Patient identified by MRN, date of birth, ID band Patient awake    Reviewed: Allergy & Precautions, H&P , NPO status , Patient's Chart, lab work & pertinent test results, reviewed documented beta blocker date and time   Airway Mallampati: II  TM Distance: >3 FB Neck ROM: full    Dental no notable dental hx.    Pulmonary neg pulmonary ROS,    Pulmonary exam normal breath sounds clear to auscultation       Cardiovascular Exercise Tolerance: Good negative cardio ROS   Rhythm:regular Rate:Normal     Neuro/Psych  Headaches, PSYCHIATRIC DISORDERS Anxiety  Neuromuscular disease    GI/Hepatic negative GI ROS, Neg liver ROS,   Endo/Other  negative endocrine ROS  Renal/GU negative Renal ROS  negative genitourinary   Musculoskeletal   Abdominal   Peds  Hematology negative hematology ROS (+)   Anesthesia Other Findings   Reproductive/Obstetrics negative OB ROS                             Anesthesia Physical Anesthesia Plan  ASA: 2  Anesthesia Plan: General   Post-op Pain Management:    Induction:   PONV Risk Score and Plan: Propofol infusion  Airway Management Planned:   Additional Equipment:   Intra-op Plan:   Post-operative Plan:   Informed Consent: I have reviewed the patients History and Physical, chart, labs and discussed the procedure including the risks, benefits and alternatives for the proposed anesthesia with the patient or authorized representative who has indicated his/her understanding and acceptance.     Dental Advisory Given  Plan Discussed with: CRNA  Anesthesia Plan Comments:         Anesthesia Quick Evaluation

## 2021-02-20 NOTE — Interval H&P Note (Signed)
History and Physical Interval Note:  02/20/2021 8:01 AM  Shirley Murray  has presented today for surgery, with the diagnosis of colon cancer screening, FH colon cancer.  The various methods of treatment have been discussed with the patient and family. After consideration of risks, benefits and other options for treatment, the patient has consented to  Procedure(s) with comments: COLONOSCOPY WITH PROPOFOL (N/A) - 8:15am as a surgical intervention.  The patient's history has been reviewed, patient examined, no change in status, stable for surgery.  I have reviewed the patient's chart and labs.  Questions were answered to the patient's satisfaction.     Eloise Harman

## 2021-02-20 NOTE — Op Note (Signed)
[facility] Patient Name: Shirley Murray Procedure Date: 02/20/2021 7:58 AM MRN: [id number] Date of Birth: 01/16/1971 Attending MD: Elon Alas. Abbey Chatters DO CSN: [id number] Age: 50 Admit Type: Outpatient Procedure:                Colonoscopy Indications:              Colon cancer screening in patient at increased                            risk: Family history of colorectal cancer in                            multiple 2nd degree relatives Providers:                Elon Alas. Abbey Chatters, DO, Jessica Boudreaux, Randa Spike, Technician Referring MD:              Medicines:                See the Anesthesia note for documentation of the                            administered medications Complications:            No immediate complications. Estimated Blood Loss:     Estimated blood loss was minimal. Procedure:                Pre-Anesthesia Assessment:                           - The anesthesia plan was to use monitored                            anesthesia care (MAC).                           After obtaining informed consent, the colonoscope                            was passed under direct vision. Throughout the                            procedure, the patient's blood pressure, pulse, and                            oxygen saturations were monitored continuously. The                            [id number]-HQ190L ([id number]) scope was introduced through                            the anus and advanced to the the terminal ileum,                            with identification of the appendiceal orifice and  IC valve. The colonoscopy was performed without                            difficulty. The patient tolerated the procedure                            well. The quality of the bowel preparation was                            evaluated using the BBPS [facility] Bowel Preparation                            Scale) with scores of: Right Colon = 3,  Transverse                            Colon = 3 and Left Colon = 3 (entire mucosa seen                            well with no residual staining, small fragments of                            stool or opaque liquid). The total BBPS score                            equals 9. Scope In: 8:09:22 AM Scope Out: 8:23:53 AM Scope Withdrawal Time: 0 hours 9 minutes 28 seconds  Total Procedure Duration: 0 hours 14 minutes 31 seconds  Findings:      The perianal and digital rectal examinations were normal.      Non-bleeding internal hemorrhoids were found during endoscopy.      A few small-mouthed diverticula were found in the sigmoid colon.      Two sessile polyps were found in the ascending colon. The polyps were 4       to 6 mm in size. These polyps were removed with a cold snare. Resection       and retrieval were complete.      A 2 mm polyp was found in the cecum. The polyp was sessile. The polyp       was removed with a cold biopsy forceps. Resection and retrieval were       complete.      The exam was otherwise without abnormality. Impression:               - Non-bleeding internal hemorrhoids.                           - Diverticulosis in the sigmoid colon.                           - Two 4 to 6 mm polyps in the ascending colon,                            removed with a cold snare. Resected and retrieved.                           -  One 2 mm polyp in the cecum, removed with a cold                            biopsy forceps. Resected and retrieved.                           - The examination was otherwise normal. Moderate Sedation:      Per Anesthesia Care Recommendation:           - Patient has a contact number available for                            emergencies. The signs and symptoms of potential                            delayed complications were discussed with the                            patient. Return to normal activities tomorrow.                            Written discharge  instructions were provided to the                            patient.                           - Resume previous diet.                           - Continue present medications.                           - Await pathology results.                           - Repeat colonoscopy in 5 years for surveillance.                           - Return to GI clinic PRN. Procedure Code(s):        --- Professional ---                           [phone], Colonoscopy, flexible; with removal of                            tumor(s), polyp(s), or other lesion(s) by snare                            technique                           45380, 59, Colonoscopy, flexible; with biopsy,                            single or multiple Diagnosis Code(s):        ---  Professional ---                           K63.5, Polyp of colon                           Z80.0, Family history of malignant neoplasm of                            digestive organs                           K64.8, Other hemorrhoids                           K57.30, Diverticulosis of large intestine without                            perforation or abscess without bleeding CPT copyright 2019 American Medical Association. All rights reserved. The codes documented in this report are preliminary and upon coder review may  be revised to meet current compliance requirements. Elon Alas. Abbey Chatters, DO [place] Abbey Chatters, DO 02/20/2021 8:28:09 AM This report has been signed electronically. Number of Addenda: 0

## 2021-02-20 NOTE — Anesthesia Postprocedure Evaluation (Signed)
Anesthesia Post Note  Patient: Shirley Murray  Procedure(s) Performed: COLONOSCOPY WITH PROPOFOL BIOPSY POLYPECTOMY  Patient location during evaluation: Phase II Anesthesia Type: General Level of consciousness: awake Pain management: pain level controlled Vital Signs Assessment: post-procedure vital signs reviewed and stable Respiratory status: spontaneous breathing and respiratory function stable Cardiovascular status: blood pressure returned to baseline and stable Postop Assessment: no headache and no apparent nausea or vomiting Anesthetic complications: no Comments: Late entry   No notable events documented.   Last Vitals:  Vitals:   02/20/21 0704 02/20/21 0827  BP: 109/70 (!) 93/58  Pulse:    Resp:  14  Temp:  36.4 C  SpO2:  100%    Last Pain:  Vitals:   02/20/21 0827  TempSrc: Oral  PainSc: 0-No pain                 Louann Sjogren

## 2021-02-20 NOTE — Discharge Instructions (Addendum)
  Colonoscopy Discharge Instructions  Read the instructions outlined below and refer to this sheet in the next few weeks. These discharge instructions provide you with general information on caring for yourself after you leave the hospital. Your doctor may also give you specific instructions. While your treatment has been planned according to the most current medical practices available, unavoidable complications occasionally occur.   ACTIVITY You may resume your regular activity, but move at a slower pace for the next 24 hours.  Take frequent rest periods for the next 24 hours.  Walking will help get rid of the air and reduce the bloated feeling in your belly (abdomen).  No driving for 24 hours (because of the medicine (anesthesia) used during the test).   Do not sign any important legal documents or operate any machinery for 24 hours (because of the anesthesia used during the test).  NUTRITION Drink plenty of fluids.  You may resume your normal diet as instructed by your doctor.  Begin with a light meal and progress to your normal diet. Heavy or fried foods are harder to digest and may make you feel sick to your stomach (nauseated).  Avoid alcoholic beverages for 24 hours or as instructed.  MEDICATIONS You may resume your normal medications unless your doctor tells you otherwise.  WHAT YOU CAN EXPECT TODAY Some feelings of bloating in the abdomen.  Passage of more gas than usual.  Spotting of blood in your stool or on the toilet paper.  IF YOU HAD POLYPS REMOVED DURING THE COLONOSCOPY: No aspirin products for 7 days or as instructed.  No alcohol for 7 days or as instructed.  Eat a soft diet for the next 24 hours.  FINDING OUT THE RESULTS OF YOUR TEST Not all test results are available during your visit. If your test results are not back during the visit, make an appointment with your caregiver to find out the results. Do not assume everything is normal if you have not heard from your  caregiver or the medical facility. It is important for you to follow up on all of your test results.  SEEK IMMEDIATE MEDICAL ATTENTION IF: You have more than a spotting of blood in your stool.  Your belly is swollen (abdominal distention).  You are nauseated or vomiting.  You have a temperature over 101.  You have abdominal pain or discomfort that is severe or gets worse throughout the day.   Your colonoscopy revealed 3 polyp(s) which I removed successfully. Await pathology results, my office will contact you. I recommend repeating colonoscopy in 5 years for surveillance purposes.   You also have diverticulosis and internal hemorrhoids. I would recommend increasing fiber in your diet or adding OTC Benefiber/Metamucil. Be sure to drink at least 4 to 6 glasses of water daily. Follow-up with GI as needed.   I hope you have a great rest of your week!  Charles K. Carver, D.O. Gastroenterology and Hepatology [facility]  

## 2021-02-23 LAB — SURGICAL PATHOLOGY

## 2021-02-25 ENCOUNTER — Encounter (HOSPITAL_COMMUNITY): Payer: Self-pay | Admitting: Internal Medicine

## 2021-03-19 ENCOUNTER — Telehealth: Payer: No Typology Code available for payment source | Admitting: Physician Assistant

## 2021-03-19 DIAGNOSIS — J069 Acute upper respiratory infection, unspecified: Secondary | ICD-10-CM

## 2021-03-19 DIAGNOSIS — B9689 Other specified bacterial agents as the cause of diseases classified elsewhere: Secondary | ICD-10-CM | POA: Diagnosis not present

## 2021-03-19 MED ORDER — DOXYCYCLINE HYCLATE 100 MG PO TABS
100.0000 mg | ORAL_TABLET | Freq: Two times a day (BID) | ORAL | 0 refills | Status: DC
Start: 2021-03-19 — End: 2021-05-01

## 2021-03-19 NOTE — Progress Notes (Signed)
E-Visit for Sinus Problems  We are sorry that you are not feeling well.  Here is how we plan to help!  Based on what you have shared with me it looks like you have sinusitis.  Sinusitis is inflammation and infection in the sinus cavities of the head.  Based on your presentation I believe you most likely have Acute Bacterial Sinusitis.  This is an infection caused by bacteria and is treated with antibiotics. I have prescribed Doxycycline 100mg  by mouth twice a day for 10 days. You may use an oral decongestant such as Mucinex D or if you have glaucoma or high blood pressure use plain Mucinex. Saline nasal spray help and can safely be used as often as needed for congestion.  If you develop worsening sinus pain, fever or notice severe headache and vision changes, or if symptoms are not better after completion of antibiotic, please schedule an appointment with a health care provider.    Sinus infections are not as easily transmitted as other respiratory infection, however we still recommend that you avoid close contact with loved ones, especially the very young and elderly.  Remember to wash your hands thoroughly throughout the day as this is the number one way to prevent the spread of infection!  Home Care: Only take medications as instructed by your medical team. Complete the entire course of an antibiotic. Do not take these medications with alcohol. A steam or ultrasonic humidifier can help congestion.  You can place a towel over your head and breathe in the steam from hot water coming from a faucet. Avoid close contacts especially the very young and the elderly. Cover your mouth when you cough or sneeze. Always remember to wash your hands.  Get Help Right Away If: You develop worsening fever or sinus pain. You develop a severe head ache or visual changes. Your symptoms persist after you have completed your treatment plan.  Make sure you Understand these instructions. Will watch your  condition. Will get help right away if you are not doing well or get worse.  Thank you for choosing an e-visit.  Your e-visit answers were reviewed by a board certified advanced clinical practitioner to complete your personal care plan. Depending upon the condition, your plan could have included both over the counter or prescription medications.  Please review your pharmacy choice. Make sure the pharmacy is open so you can pick up prescription now. If there is a problem, you may contact your provider through [employer] and have the prescription routed to another pharmacy.  Your safety is important to [place]. If you have drug allergies check your prescription carefully.   For the next 24 hours you can use MyChart to ask questions about today's visit, request a non-urgent call back, or ask for a work or school excuse. You will get an email in the next two days asking about your experience. I hope that your e-visit has been valuable and will speed your recovery.   I provided 6 minutes of non face-to-face time during this encounter for chart review and documentation.

## 2021-04-05 ENCOUNTER — Encounter: Payer: Self-pay | Admitting: Family Medicine

## 2021-04-28 ENCOUNTER — Encounter: Payer: Self-pay | Admitting: Family Medicine

## 2021-04-28 NOTE — Telephone Encounter (Signed)
Please do work note

## 2021-05-01 ENCOUNTER — Other Ambulatory Visit: Payer: Self-pay

## 2021-05-01 ENCOUNTER — Ambulatory Visit: Payer: No Typology Code available for payment source | Admitting: Nurse Practitioner

## 2021-05-01 VITALS — BP 104/72 | HR 62 | Temp 98.2°F | Ht 66.0 in | Wt 125.0 lb

## 2021-05-01 DIAGNOSIS — F419 Anxiety disorder, unspecified: Secondary | ICD-10-CM | POA: Diagnosis not present

## 2021-05-01 DIAGNOSIS — I959 Hypotension, unspecified: Secondary | ICD-10-CM | POA: Diagnosis not present

## 2021-05-01 DIAGNOSIS — R55 Syncope and collapse: Secondary | ICD-10-CM

## 2021-05-01 MED ORDER — CLONAZEPAM 0.5 MG PO TABS: ORAL_TABLET | ORAL | 2 refills | Status: DC

## 2021-05-01 MED ORDER — MINOCYCLINE HCL 50 MG PO CAPS: 50.0000 mg | ORAL_CAPSULE | Freq: Two times a day (BID) | ORAL | 2 refills | Status: DC

## 2021-05-01 NOTE — Progress Notes (Signed)
Subjective:    Patient ID: Shirley Murray, female    DOB: May 21, 1971, 50 y.o.   MRN: [id number]  HPI Low BP readings- syncopy 6 days ago- reports 80/40 at  home Patient presents for complaints of low blood pressure off and on for the past month.  Has had some near syncopal episodes a few times but was able to catch herself without falling.  Had a brief episode of syncope 7 days ago where she fell.  Has some slight bruising underneath the left eye after falling on her left side.  States her injuries are minimal.  No visual changes.  No unusual headaches.  States her BP at that time was 88/42.  States she was decorating her tree at the time.  States she hydrates on a regular basis and eats meals and snacks on a regular basis.  Episodes start with a "lump in her throat".  Chest feels "heavy or full".  Then she begins to get dizzy.  Denies any loss of vision, numbness or weakness of the face arms or legs or difficulty speaking or swallowing.  Denies any seizure activity.  No flushing or nausea.  No palpitations or irregular heartbeat.  States her heart rate actually runs on the low side.  Patient is not experiencing the symptoms today.  Denies any OTC herbals or supplements that would affect her blood pressure. Takes a rare Klonopin for sleep, states this has been much better lately.  Has a history of chronic anxiety which she has been able to manage on her own. Patient is also asking for refill on her minocycline for her facial acne.      Objective:   Physical Exam NAD.  Alert, oriented.  Making good eye contact.  Mildly anxious affect.  Dressed appropriately.  Thoughts logical coherent and relevant.  Speech clear.  Thyroid nontender to palpation, no mass or goiter noted.  Lungs clear.  Heart regular rate rhythm.  No murmur or gallop noted.  Orthostatic BP and pulse stable.  EKG NSR. Today's Vitals   05/01/21 1016  BP: 104/72  Pulse: 62  Temp: 98.2 F (36.8 C)  SpO2: 99%  Weight: 125 lb (56.7  kg)  Height: 5\' 6"  (1.676 m)   Body mass index is 20.18 kg/m.        Assessment & Plan:   Problem List Items Addressed This Visit       Cardiovascular and Mediastinum   Hypotension   Relevant Orders   Ambulatory referral to Cardiology     Other   Anxiety   Syncope - Primary   Relevant Orders   EKG 12-Lead (Completed)   CBC with Differential   Comprehensive Metabolic Panel (CMET)   TSH   Ambulatory referral to Cardiology     Meds ordered this encounter  Medications   clonazePAM (KLONOPIN) 0.5 MG tablet    Sig: TAKE 1/2 TABLET BY MOUTH DURING THE DAY AND 1 TABLET EVERY NIGHT AT BEDTIME AS NEEDED    Dispense:  45 tablet    Refill:  2    Please dispense 30 days from her last refill    Order Specific Question:   Supervising Provider    Answer:   Sallee Lange A [[id number]]   minocycline (MINOCIN) 50 MG capsule    Sig: Take 1 capsule (50 mg total) by mouth 2 (two) times daily. Prn acne    Dispense:  60 capsule    Refill:  2    Order Specific Question:  Supervising Provider    Answer:   Sallee Lange A [[id number]]   Discuss factors that may be contributing to her near syncope/syncope episodes. Lengthy discussion regarding her anxiety, patient defers daily medication at this time but will contact us if she changes her mind. With her blood pressure running on the lower side, reminded patient to stay hydrated and to eat regular meals and snacks. Patient wishes to proceed with referral to cardiology for evaluation.  To contact our office or go to urgent care/ED in the meantime if symptoms worsen or new symptoms develop. Refill on Klonopin for occasional use for sleep.

## 2021-05-03 ENCOUNTER — Encounter: Payer: Self-pay | Admitting: Nurse Practitioner

## 2021-05-03 DIAGNOSIS — R55 Syncope and collapse: Secondary | ICD-10-CM | POA: Insufficient documentation

## 2021-05-03 DIAGNOSIS — I959 Hypotension, unspecified: Secondary | ICD-10-CM | POA: Insufficient documentation

## 2021-06-28 ENCOUNTER — Telehealth: Payer: No Typology Code available for payment source | Admitting: Nurse Practitioner

## 2021-06-28 ENCOUNTER — Encounter: Payer: Self-pay | Admitting: Nurse Practitioner

## 2021-06-28 DIAGNOSIS — B379 Candidiasis, unspecified: Secondary | ICD-10-CM

## 2021-06-28 DIAGNOSIS — T3695XA Adverse effect of unspecified systemic antibiotic, initial encounter: Secondary | ICD-10-CM | POA: Diagnosis not present

## 2021-06-28 DIAGNOSIS — J019 Acute sinusitis, unspecified: Secondary | ICD-10-CM

## 2021-06-28 DIAGNOSIS — B9689 Other specified bacterial agents as the cause of diseases classified elsewhere: Secondary | ICD-10-CM | POA: Diagnosis not present

## 2021-06-28 MED ORDER — FLUCONAZOLE 150 MG PO TABS
150.0000 mg | ORAL_TABLET | Freq: Once | ORAL | 0 refills | Status: AC
Start: 2021-06-28 — End: 2021-06-28

## 2021-06-28 MED ORDER — DOXYCYCLINE HYCLATE 100 MG PO TABS: 100.0000 mg | ORAL_TABLET | Freq: Two times a day (BID) | ORAL | 0 refills | Status: AC

## 2021-06-28 NOTE — Progress Notes (Signed)

## 2021-06-28 NOTE — Progress Notes (Signed)
I have spent 5 minutes in review of e-visit questionnaire, review and updating patient chart, medical decision making and response to patient.  ° °Babette Stum W Lieutenant Abarca, NP ° °  °

## 2021-06-29 ENCOUNTER — Other Ambulatory Visit: Payer: Self-pay | Admitting: Family Medicine

## 2021-06-29 DIAGNOSIS — G4700 Insomnia, unspecified: Secondary | ICD-10-CM

## 2021-06-29 MED ORDER — ESZOPICLONE 1 MG PO TABS: 1.0000 mg | ORAL_TABLET | Freq: Every evening | ORAL | 0 refills | Status: DC | PRN

## 2021-07-17 ENCOUNTER — Encounter: Payer: Self-pay | Admitting: Nurse Practitioner

## 2021-07-18 ENCOUNTER — Other Ambulatory Visit: Payer: Self-pay | Admitting: Nurse Practitioner

## 2021-07-18 ENCOUNTER — Encounter: Payer: Self-pay | Admitting: Nurse Practitioner

## 2021-07-18 MED ORDER — ESCITALOPRAM OXALATE 10 MG PO TABS: 10.0000 mg | ORAL_TABLET | Freq: Every day | ORAL | 0 refills | Status: DC

## 2021-07-26 ENCOUNTER — Other Ambulatory Visit: Payer: Self-pay | Admitting: Nurse Practitioner

## 2021-07-26 MED ORDER — ESCITALOPRAM OXALATE 10 MG PO TABS: 10.0000 mg | ORAL_TABLET | Freq: Every day | ORAL | 0 refills | Status: DC

## 2021-08-19 ENCOUNTER — Other Ambulatory Visit (HOSPITAL_COMMUNITY): Payer: Self-pay | Admitting: Family Medicine

## 2021-08-19 DIAGNOSIS — Z1231 Encounter for screening mammogram for malignant neoplasm of breast: Secondary | ICD-10-CM

## 2021-08-28 ENCOUNTER — Ambulatory Visit (HOSPITAL_COMMUNITY)
Admission: RE | Admit: 2021-08-28 | Discharge: 2021-08-28 | Disposition: A | Discharge status: Home / Self Care | Payer: No Typology Code available for payment source | Source: Ambulatory Visit | Attending: Family Medicine | Admitting: Family Medicine

## 2021-08-28 DIAGNOSIS — Z1231 Encounter for screening mammogram for malignant neoplasm of breast: Secondary | ICD-10-CM | POA: Diagnosis not present

## 2021-08-31 ENCOUNTER — Other Ambulatory Visit (HOSPITAL_COMMUNITY): Payer: Self-pay | Admitting: Family Medicine

## 2021-08-31 DIAGNOSIS — R928 Other abnormal and inconclusive findings on diagnostic imaging of breast: Secondary | ICD-10-CM

## 2021-09-22 ENCOUNTER — Ambulatory Visit (HOSPITAL_COMMUNITY)
Admission: RE | Admit: 2021-09-22 | Discharge: 2021-09-22 | Disposition: A | Discharge status: Home / Self Care | Payer: No Typology Code available for payment source | Source: Ambulatory Visit | Attending: Family Medicine | Admitting: Family Medicine

## 2021-09-22 ENCOUNTER — Encounter (HOSPITAL_COMMUNITY): Payer: Self-pay

## 2021-09-22 DIAGNOSIS — R928 Other abnormal and inconclusive findings on diagnostic imaging of breast: Secondary | ICD-10-CM | POA: Insufficient documentation

## 2021-12-10 ENCOUNTER — Encounter: Payer: Self-pay | Admitting: Nurse Practitioner

## 2021-12-17 ENCOUNTER — Encounter: Payer: Self-pay | Admitting: Nurse Practitioner

## 2021-12-18 ENCOUNTER — Other Ambulatory Visit: Payer: Self-pay | Admitting: Nurse Practitioner

## 2021-12-18 MED ORDER — CLONAZEPAM 0.5 MG PO TABS: ORAL_TABLET | ORAL | 0 refills | Status: DC

## 2021-12-18 MED ORDER — MINOCYCLINE HCL 50 MG PO CAPS: 50.0000 mg | ORAL_CAPSULE | Freq: Two times a day (BID) | ORAL | 0 refills | Status: DC

## 2022-01-01 ENCOUNTER — Encounter: Payer: Self-pay | Admitting: Nurse Practitioner

## 2022-01-01 ENCOUNTER — Ambulatory Visit (INDEPENDENT_AMBULATORY_CARE_PROVIDER_SITE_OTHER): Payer: No Typology Code available for payment source | Admitting: Nurse Practitioner

## 2022-01-01 VITALS — BP 100/63 | HR 63 | Temp 99.1°F | Ht 64.5 in | Wt 131.0 lb

## 2022-01-01 DIAGNOSIS — I959 Hypotension, unspecified: Secondary | ICD-10-CM

## 2022-01-01 DIAGNOSIS — G43109 Migraine with aura, not intractable, without status migrainosus: Secondary | ICD-10-CM | POA: Diagnosis not present

## 2022-01-01 DIAGNOSIS — Z13220 Encounter for screening for lipoid disorders: Secondary | ICD-10-CM

## 2022-01-01 DIAGNOSIS — Z79899 Other long term (current) drug therapy: Secondary | ICD-10-CM

## 2022-01-01 DIAGNOSIS — G4700 Insomnia, unspecified: Secondary | ICD-10-CM

## 2022-01-01 DIAGNOSIS — Z01419 Encounter for gynecological examination (general) (routine) without abnormal findings: Secondary | ICD-10-CM | POA: Diagnosis not present

## 2022-01-01 DIAGNOSIS — R9230 Dense breasts, unspecified: Secondary | ICD-10-CM

## 2022-01-01 DIAGNOSIS — R922 Inconclusive mammogram: Secondary | ICD-10-CM

## 2022-01-01 DIAGNOSIS — Z1329 Encounter for screening for other suspected endocrine disorder: Secondary | ICD-10-CM

## 2022-01-01 MED ORDER — CLONAZEPAM 0.5 MG PO TABS: ORAL_TABLET | ORAL | 0 refills | Status: DC

## 2022-01-01 MED ORDER — ESZOPICLONE 1 MG PO TABS: 1.0000 mg | ORAL_TABLET | Freq: Every evening | ORAL | 0 refills | Status: DC | PRN

## 2022-01-01 MED ORDER — PROMETHAZINE HCL 25 MG PO TABS: ORAL_TABLET | Freq: Four times a day (QID) | ORAL | 2 refills | Status: DC | PRN

## 2022-01-01 MED ORDER — MINOCYCLINE HCL 50 MG PO CAPS: 50.0000 mg | ORAL_CAPSULE | Freq: Two times a day (BID) | ORAL | 2 refills | Status: DC

## 2022-01-01 NOTE — Progress Notes (Unsigned)
Subjective:    Patient ID: Shirley Murray, female    DOB: Oct 21, 1970, 51 y.o.   MRN: [id number]  HPI The patient comes in today for a wellness visit.    A review of their health history was completed.  A review of medications was also completed.  Any needed refills; Minocycline and Klonopin and Lunesta if OK  Eating habits: gpood  Falls/  MVA accidents in past few months: none  Regular exercise: walk and biking   Specialist pt sees on regular basis: none  Preventative health issues were discussed.   Additional concerns:  pt missed work on Wednesday and Thursday due to stomach virus.  Sinus congestion for the past 2 weeks. No fever.  COVID-negative.  Some ear fullness.  Clear drainage.  No cough.  Has been using Zyrtec and Flonase. Remote history of genetic testing but unsure what was included.  TVH with BSO. Same female sexual partner for over 2 1/2 years. Defers STI testing.  Mammogram and colonoscopy UTD. Regular vision and dental exams.    Review of Systems  Constitutional:  Negative for activity change, appetite change and fatigue.  HENT:  Negative for sore throat and trouble swallowing.   Respiratory:  Negative for cough, chest tightness, shortness of breath and wheezing.   Cardiovascular:  Negative for chest pain.  Gastrointestinal:  Positive for constipation and nausea. Negative for abdominal distention, abdominal pain, diarrhea and vomiting.       Nausea mainly with migraines.  Genitourinary:  Negative for difficulty urinating, dysuria, enuresis, frequency, genital sores, menstrual problem, pelvic pain, urgency and vaginal discharge.  Psychiatric/Behavioral:  Positive for sleep disturbance.        Struggling with the loss of her son back on Christmas day.         Objective:   Physical Exam Vitals and nursing note reviewed.  Constitutional:      General: She is not in acute distress.    Appearance: She is well-developed.  Neck:     Thyroid: No thyromegaly.      Trachea: No tracheal deviation.     Comments: Thyroid non tender to palpation. No mass or goiter noted.  Cardiovascular:     Rate and Rhythm: Normal rate and regular rhythm.     Heart sounds: Normal heart sounds. No murmur heard. Pulmonary:     Effort: Pulmonary effort is normal.     Breath sounds: Normal breath sounds.  Chest:  Breasts:    Right: No swelling, inverted nipple, mass, skin change or tenderness.     Left: No swelling, inverted nipple, mass, skin change or tenderness.  Abdominal:     General: There is no distension.     Palpations: Abdomen is soft.     Tenderness: There is no abdominal tenderness.  Genitourinary:    Comments: External GU: no rashes or lesions. Vagina: pink and moist, no discharge. Cervix absent. Bimanual exam: no tenderness or masses.  Musculoskeletal:     Cervical back: Normal range of motion and neck supple.  Lymphadenopathy:     Cervical: No cervical adenopathy.     Upper Body:     Right upper body: No supraclavicular, axillary or pectoral adenopathy.     Left upper body: No supraclavicular, axillary or pectoral adenopathy.  Skin:    General: Skin is warm and dry.     Findings: No rash.  Neurological:     Mental Status: She is alert and oriented to person, place, and time.  Psychiatric:  Mood and Affect: Mood normal.        Behavior: Behavior normal.        Thought Content: Thought content normal.        Judgment: Judgment normal.    Today's Vitals   01/01/22 1312  BP: 100/63  Pulse: 63  Temp: 99.1 F (37.3 C)  SpO2: 100%  Weight: 131 lb (59.4 kg)  Height: 5' 4.5" (1.638 m)   Body mass index is 22.14 kg/m.  Tyrer Cusick lifetime breast cancer risk: 9%       Assessment & Plan:   Problem List Items Addressed This Visit       Cardiovascular and Mediastinum   Hypotension   Relevant Orders   CBC with Differential   Comprehensive metabolic panel   Lipid panel   TSH   Migraine with aura   Relevant Medications    clonazePAM (KLONOPIN) 0.5 MG tablet   promethazine (PHENERGAN) 25 MG tablet   Other Relevant Orders   CBC with Differential   Comprehensive metabolic panel   Lipid panel   TSH     Other   Dense breasts   Insomnia   Other Visit Diagnoses     Well woman exam    -  Primary   Relevant Orders   CBC with Differential   Comprehensive metabolic panel   Lipid panel   TSH   High risk medication use       Relevant Orders   CBC with Differential   Comprehensive metabolic panel   Lipid panel   TSH   Lipid screening       Relevant Orders   CBC with Differential   Comprehensive metabolic panel   Lipid panel   TSH   Screening for thyroid disorder       Relevant Orders   CBC with Differential   Comprehensive metabolic panel   Lipid panel   TSH      Labs pending. Recommend Shingles vaccine at local pharmacy. Meds ordered this encounter  Medications   eszopiclone (LUNESTA) 1 MG TABS tablet    Sig: Take 1 tablet (1 mg total) by mouth at bedtime as needed for sleep. Take immediately before bedtime    Dispense:  21 tablet    Refill:  0    Order Specific Question:   Supervising Provider    Answer:   Sallee Lange A [[id number]]   clonazePAM (KLONOPIN) 0.5 MG tablet    Sig: TAKE 1/2 TABLET BY MOUTH DURING THE DAY AND 1 TABLET EVERY NIGHT AT BEDTIME AS NEEDED    Dispense:  45 tablet    Refill:  0    Order Specific Question:   Supervising Provider    Answer:   Sallee Lange A [[id number]]   minocycline (MINOCIN) 50 MG capsule    Sig: Take 1 capsule (50 mg total) by mouth 2 (two) times daily. Prn acne    Dispense:  60 capsule    Refill:  2    Order Specific Question:   Supervising Provider    Answer:   Sallee Lange A [[id number]]   promethazine (PHENERGAN) 25 MG tablet    Sig: TAKE 1 TABLET BY MOUTH EVERY 6 HOURS AS NEEDED    Dispense:  30 tablet    Refill:  2    Order Specific Question:   Supervising Provider    Answer:   Sallee Lange A [[id number]]   RF on medications. Reminded patient not to  take clonazepam within 4 hours of Lunesta. Verbalizes  understanding. Very rare use of Lunesta.  Promethazine as directed for N/V associated with migraines.  Return in about 6 months (around 07/04/2022).

## 2022-01-02 ENCOUNTER — Encounter: Payer: Self-pay | Admitting: Nurse Practitioner

## 2022-01-02 DIAGNOSIS — R922 Inconclusive mammogram: Secondary | ICD-10-CM | POA: Insufficient documentation

## 2022-02-26 ENCOUNTER — Other Ambulatory Visit: Payer: Self-pay | Admitting: Nurse Practitioner

## 2022-03-03 ENCOUNTER — Encounter: Payer: Self-pay | Admitting: Nurse Practitioner

## 2022-03-05 ENCOUNTER — Other Ambulatory Visit: Payer: Self-pay | Admitting: Nurse Practitioner

## 2022-03-05 MED ORDER — TOPIRAMATE 50 MG PO TABS: 50.0000 mg | ORAL_TABLET | Freq: Two times a day (BID) | ORAL | 0 refills | Status: DC

## 2022-03-19 ENCOUNTER — Telehealth: Payer: No Typology Code available for payment source | Admitting: Emergency Medicine

## 2022-03-19 DIAGNOSIS — J019 Acute sinusitis, unspecified: Secondary | ICD-10-CM | POA: Diagnosis not present

## 2022-03-19 DIAGNOSIS — B9789 Other viral agents as the cause of diseases classified elsewhere: Secondary | ICD-10-CM

## 2022-03-19 MED ORDER — AZELASTINE HCL 0.1 % NA SOLN: 2.0000 | Freq: Two times a day (BID) | NASAL | 0 refills | Status: DC

## 2022-03-19 NOTE — Progress Notes (Signed)
E-Visit for Sinus Problems  We are sorry that you are not feeling well.  Here is how we plan to help!  Based on what you have shared with me it looks like you have sinusitis.  Sinusitis is inflammation and infection in the sinus cavities of the head.  Based on your presentation I believe you most likely have Acute Viral Sinusitis.This is an infection most likely caused by a virus. There is not specific treatment for viral sinusitis other than to help you with the symptoms until the infection runs its course.    Antibiotics are not recommended by the Infect[employer] Society of [place] unless you have severe symptoms (including high fever) or you have symptoms for more than 10 days. If you still have symptoms after 10 days, antibiotics should be considered.    Continue using your Flonase (fluticasone) daily as prescribed. I have also prescribe azelastine nasal spray for you to use.   Saline nasal spray help and can safely be used as often as needed for congestion. Try using saline irrigation, such as with a neti pot, several times a day while you are sick. Many neti pots come with salt packets premeasured to use to make saline. If you use your own salt, make sure it is kosher salt or sea salt (don't use table salt as it has iodine in it and you don't need that in your nose). Use distilled water to make saline. If you mix your own saline using your own salt, the recipe is 1/4 teaspoon salt in 1 cup warm water. Using saline irrigation can help prevent and treat sinus infections.    Some authorities believe that zinc sprays or the use of Echinacea may shorten the course of your symptoms.  Sinus infections are not as easily transmitted as other respiratory infection, however we still recommend that you avoid close contact with loved ones, especially the very young and elderly.  Remember to wash your hands thoroughly throughout the day as this is the number one way to prevent the spread of  infection!  Home Care: Only take medications as instructed by your medical team. Do not take these medications with alcohol. A steam or ultrasonic humidifier can help congestion.  You can place a towel over your head and breathe in the steam from hot water coming from a faucet. Avoid close contacts especially the very young and the elderly. Cover your mouth when you cough or sneeze. Always remember to wash your hands.  Get Help Right Away If: You develop worsening fever or sinus pain. You develop a severe head ache or visual changes. Your symptoms persist after you have completed your treatment plan.  Make sure you Understand these instructions. Will watch your condition. Will get help right away if you are not doing well or get worse.   Thank you for choosing an e-visit.  Your e-visit answers were reviewed by a board certified advanced clinical practitioner to complete your personal care plan. Depending upon the condition, your plan could have included both over the counter or prescription medications.  Please review your pharmacy choice. Make sure the pharmacy is open so you can pick up prescription now. If there is a problem, you may contact your provider through [employer] and have the prescription routed to another pharmacy.  Your safety is important to [place]. If you have drug allergies check your prescription carefully.   For the next 24 hours you can use MyChart to ask questions about today's visit, request a  non-urgent call back, or ask for a work or school excuse. You will get an email in the next two days asking about your experience. I hope that your e-visit has been valuable and will speed your recovery.  I have spent 5 minutes in review of e-visit questionnaire, review and updating patient chart, medical decision making and response to patient.   Willeen Cass, PhD, FNP-BC

## 2022-03-22 DIAGNOSIS — U071 COVID-19: Secondary | ICD-10-CM | POA: Diagnosis not present

## 2022-03-22 DIAGNOSIS — Z6820 Body mass index (BMI) 20.0-20.9, adult: Secondary | ICD-10-CM | POA: Diagnosis not present

## 2022-03-29 ENCOUNTER — Other Ambulatory Visit: Payer: Self-pay | Admitting: Nurse Practitioner

## 2022-07-14 ENCOUNTER — Other Ambulatory Visit: Payer: Self-pay | Admitting: Nurse Practitioner

## 2022-07-15 ENCOUNTER — Other Ambulatory Visit: Payer: Self-pay | Admitting: Nurse Practitioner

## 2022-07-16 MED ORDER — ESZOPICLONE 1 MG PO TABS: 1.0000 mg | ORAL_TABLET | Freq: Every evening | ORAL | 0 refills | Status: DC | PRN

## 2022-07-16 MED ORDER — CLONAZEPAM 0.5 MG PO TABS: ORAL_TABLET | ORAL | 0 refills | Status: DC

## 2022-10-21 IMAGING — MG MM DIGITAL SCREENING BILAT W/ TOMO AND CAD
8 series · 9 of 24 positions shown · non-contrast
Comparison: Previous exam(s).

CLINICAL DATA: Screening.

EXAM:
DIGITAL SCREENING BILATERAL MAMMOGRAM WITH TOMOSYNTHESIS AND CAD
TECHNIQUE: Bilateral screening digital craniocaudal and mediolateral oblique
mammograms were obtained. Bilateral screening digital breast
tomosynthesis was performed. The images were evaluated with
computer-aided detection.

[L CC synth-2D]
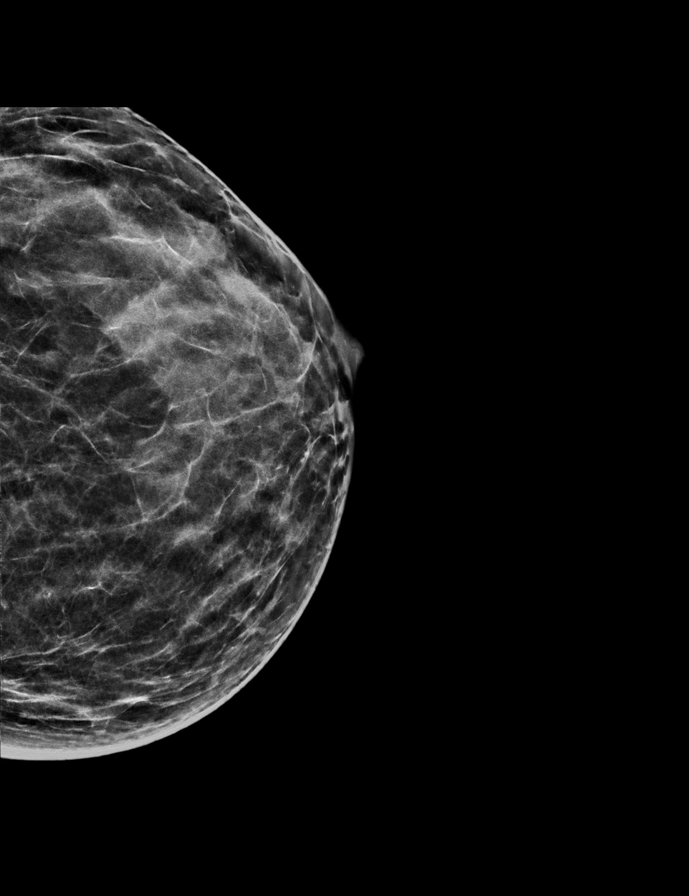

[R CC synth-2D]
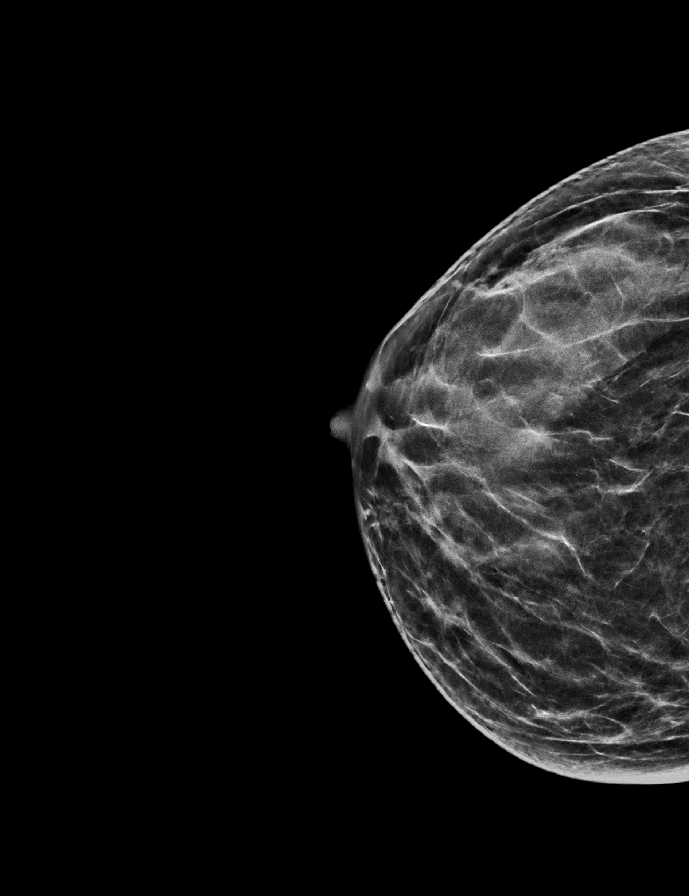

[L MLO synth-2D]
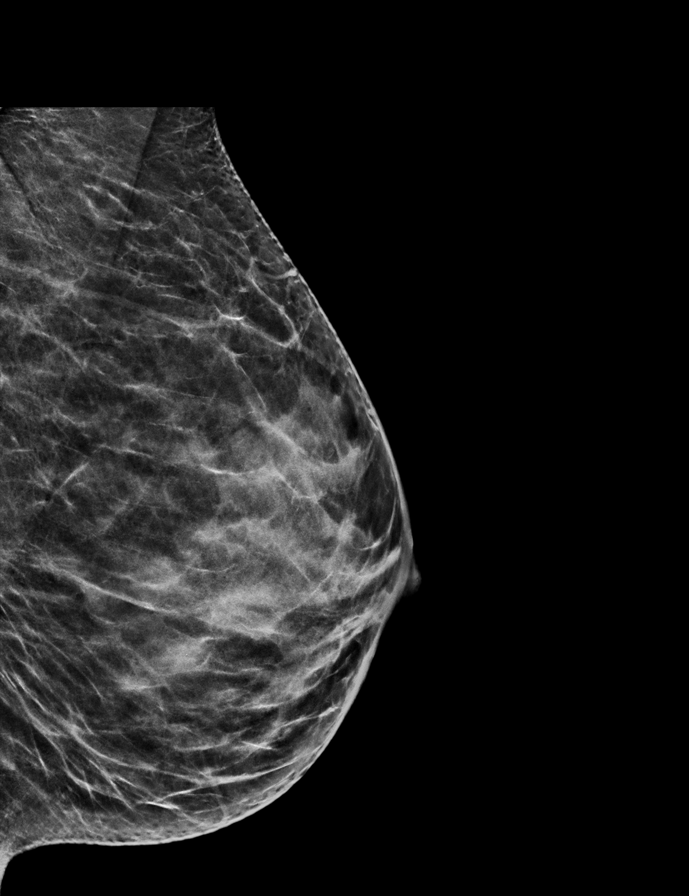

[R MLO synth-2D]
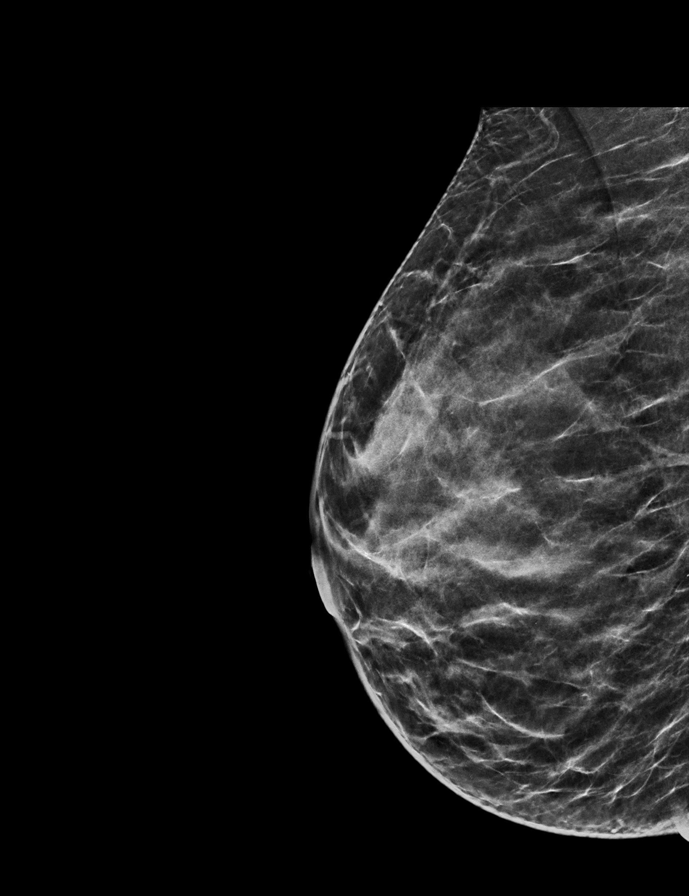

[L MLO tomo · 2 of 56 frames shown]
[frame 19/56]
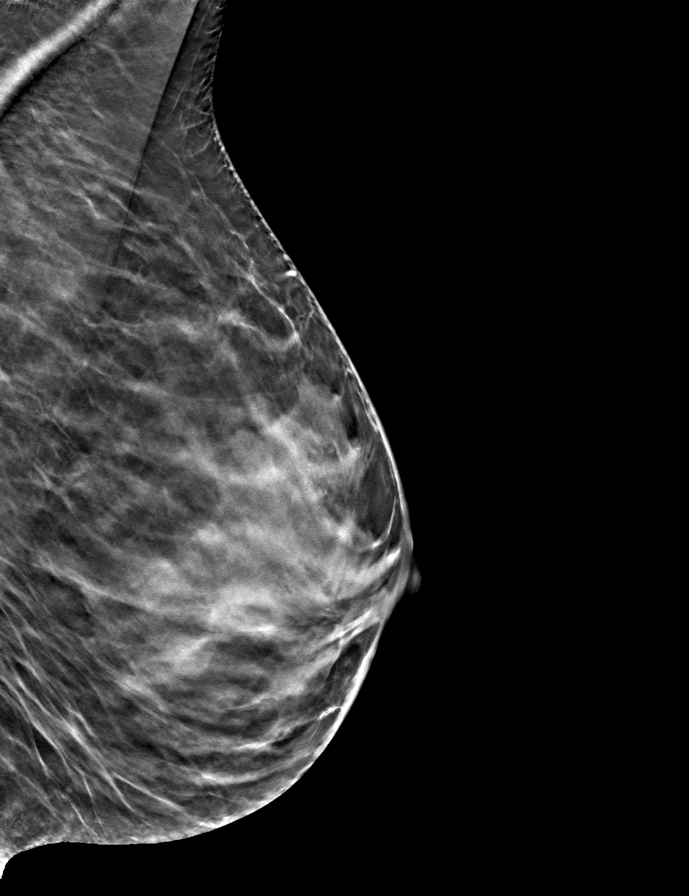
[frame 29/56]
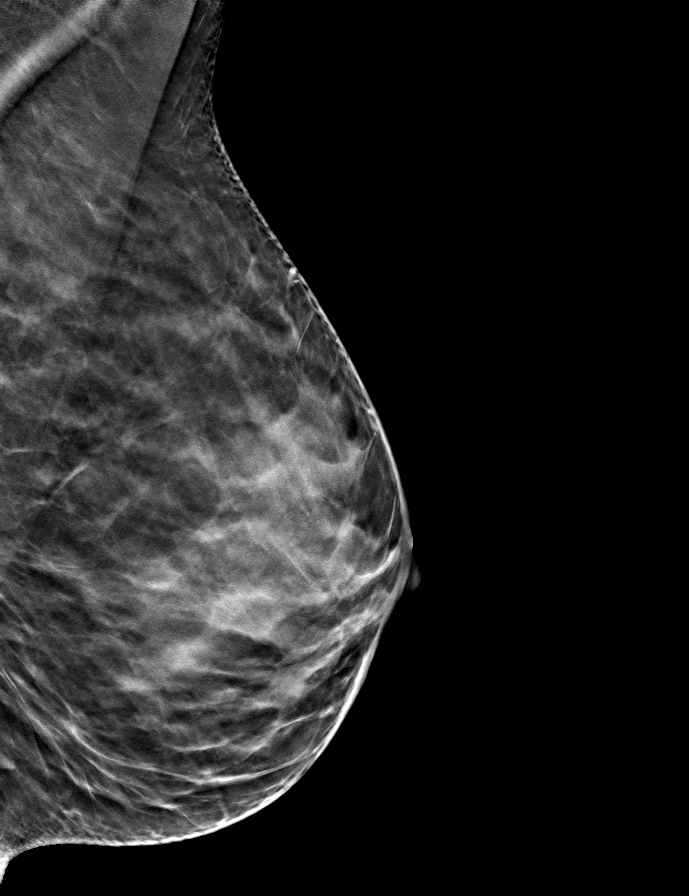

[L CC tomo · tomo slice 26/51.0]
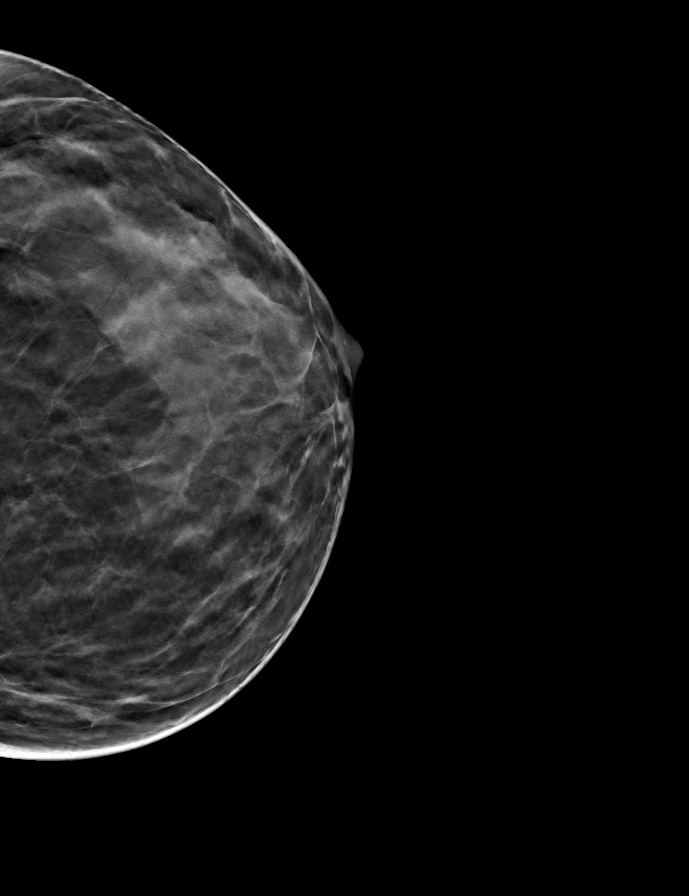

[R CC tomo · tomo slice 26/51.0]
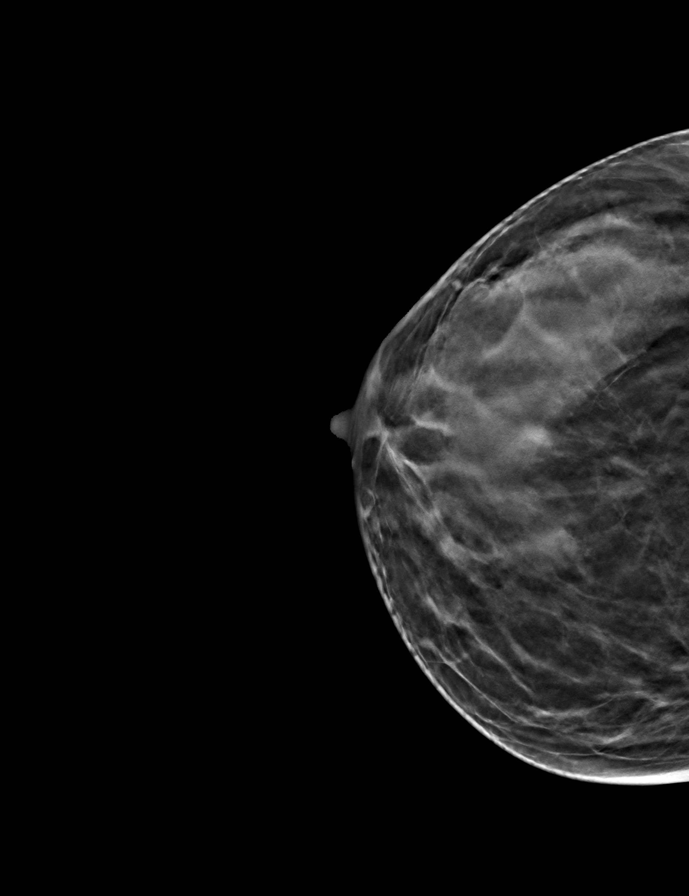

[R MLO tomo · tomo slice 25/49.0]
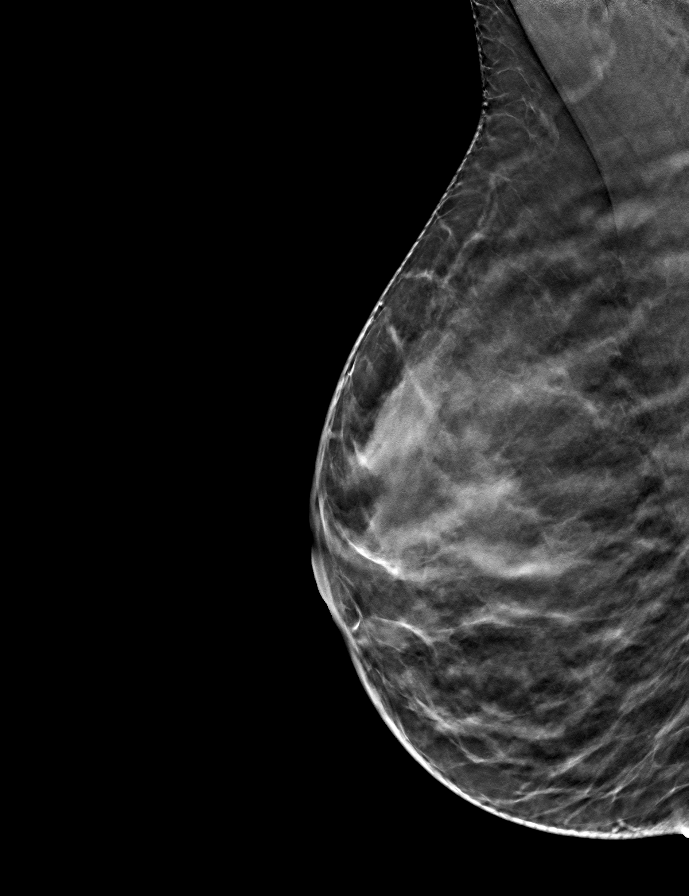

[9 of 24 positions shown; findings below may reference images not displayed]

ACR Breast Density Category c: The breast tissue is heterogeneously
dense, which may obscure small masses.
FINDINGS: In the right breast, a possible asymmetry warrants further
evaluation. In the left breast, no findings suspicious for
malignancy.
IMPRESSION: Further evaluation is suggested for possible asymmetry in the right
breast.

RECOMMENDATION:
Diagnostic mammogram and possibly ultrasound of the right breast.
(Code:[id number]-99A)

The patient will be contacted regarding the findings, and additional
imaging will be scheduled.

BI-RADS CATEGORY  0: Incomplete. Need additional imaging evaluation
and/or prior mammograms for comparison.

## 2023-02-07 ENCOUNTER — Other Ambulatory Visit (HOSPITAL_COMMUNITY): Payer: Self-pay | Admitting: Family Medicine

## 2023-02-07 DIAGNOSIS — Z1231 Encounter for screening mammogram for malignant neoplasm of breast: Secondary | ICD-10-CM

## 2023-02-09 ENCOUNTER — Ambulatory Visit (HOSPITAL_COMMUNITY)
Admission: RE | Admit: 2023-02-09 | Discharge: 2023-02-09 | Disposition: A | Discharge status: Home / Self Care | Payer: Managed Care, Other (non HMO) | Source: Ambulatory Visit | Attending: Family Medicine | Admitting: Family Medicine

## 2023-02-09 ENCOUNTER — Inpatient Hospital Stay (HOSPITAL_COMMUNITY): Admission: RE | Admit: 2023-02-09 | Payer: No Typology Code available for payment source | Source: Ambulatory Visit

## 2023-02-09 DIAGNOSIS — Z1231 Encounter for screening mammogram for malignant neoplasm of breast: Secondary | ICD-10-CM | POA: Insufficient documentation

## 2023-05-26 ENCOUNTER — Telehealth: Payer: Self-pay | Admitting: Family Medicine

## 2023-05-26 DIAGNOSIS — J019 Acute sinusitis, unspecified: Secondary | ICD-10-CM | POA: Diagnosis not present

## 2023-05-26 DIAGNOSIS — B9689 Other specified bacterial agents as the cause of diseases classified elsewhere: Secondary | ICD-10-CM

## 2023-05-26 MED ORDER — PROMETHAZINE-DM 6.25-15 MG/5ML PO SYRP: 5.0000 mL | ORAL_SOLUTION | Freq: Three times a day (TID) | ORAL | 0 refills | Status: DC | PRN

## 2023-05-26 MED ORDER — DOXYCYCLINE HYCLATE 100 MG PO TABS: 100.0000 mg | ORAL_TABLET | Freq: Two times a day (BID) | ORAL | 0 refills | Status: AC

## 2023-05-26 NOTE — Progress Notes (Signed)
E-Visit for Sinus Problems  We are sorry that you are not feeling well.  Here is how we plan to help!  Based on what you have shared with me it looks like you have sinusitis.  Sinusitis is inflammation and infection in the sinus cavities of the head.  Based on your presentation I believe you most likely have Acute Bacterial Sinusitis.  This is an infection caused by bacteria and is treated with antibiotics. I have prescribed Doxycycline 100mg  by mouth twice a day for 10 days. You may use an oral decongestant such as Mucinex D or if you have glaucoma or high blood pressure use plain Mucinex. Saline nasal spray help and can safely be used as often as needed for congestion.  If you develop worsening sinus pain, fever or notice severe headache and vision changes, or if symptoms are not better after completion of antibiotic, please schedule an appointment with a health care provider.    I will send in Promethazine DM to help with cough.   Sinus infections are not as easily transmitted as other respiratory infection, however we still recommend that you avoid close contact with loved ones, especially the very young and elderly.  Remember to wash your hands thoroughly throughout the day as this is the number one way to prevent the spread of infection!  Home Care: Only take medications as instructed by your medical team. Complete the entire course of an antibiotic. Do not take these medications with alcohol. A steam or ultrasonic humidifier can help congestion.  You can place a towel over your head and breathe in the steam from hot water coming from a faucet. Avoid close contacts especially the very young and the elderly. Cover your mouth when you cough or sneeze. Always remember to wash your hands.  Get Help Right Away If: You develop worsening fever or sinus pain. You develop a severe head ache or visual changes. Your symptoms persist after you have completed your treatment plan.  Make sure  you Understand these instructions. Will watch your condition. Will get help right away if you are not doing well or get worse.  Thank you for choosing an e-visit.  Your e-visit answers were reviewed by a board certified advanced clinical practitioner to complete your personal care plan. Depending upon the condition, your plan could have included both over the counter or prescription medications.  Please review your pharmacy choice. Make sure the pharmacy is open so you can pick up prescription now. If there is a problem, you may contact your provider through [employer] and have the prescription routed to another pharmacy.  Your safety is important to [place]. If you have drug allergies check your prescription carefully.   For the next 24 hours you can use MyChart to ask questions about today's visit, request a non-urgent call back, or ask for a work or school excuse. You will get an email in the next two days asking about your experience. I hope that your e-visit has been valuable and will speed your recovery.  I provided 5 minutes of non face-to-face time during this encounter for chart review, medication and order placement, as well as and documentation.

## 2023-06-23 ENCOUNTER — Telehealth: Payer: No Typology Code available for payment source | Admitting: Family Medicine

## 2023-06-23 DIAGNOSIS — J329 Chronic sinusitis, unspecified: Secondary | ICD-10-CM

## 2023-06-23 NOTE — Progress Notes (Signed)
  Because you were just treated for a sinus infection last month you will need to follow up in person for this one as it could be recurrent. Your condition warrants further evaluation and I recommend that you be seen in a face-to-face visit at your PCP office or local urgent care.   NOTE: There will be NO CHARGE for this E-Visit   If you are having a true medical emergency, please call 911.

## 2023-11-20 ENCOUNTER — Encounter: Payer: Self-pay | Admitting: Family Medicine

## 2023-11-22 ENCOUNTER — Telehealth: Payer: Self-pay | Admitting: Physician Assistant

## 2023-11-22 ENCOUNTER — Ambulatory Visit

## 2023-11-22 ENCOUNTER — Ambulatory Visit (INDEPENDENT_AMBULATORY_CARE_PROVIDER_SITE_OTHER): Admitting: Physician Assistant

## 2023-11-22 VITALS — BP 102/67 | HR 73 | Temp 97.9°F | Ht 64.5 in | Wt 141.2 lb

## 2023-11-22 DIAGNOSIS — N6320 Unspecified lump in the left breast, unspecified quadrant: Secondary | ICD-10-CM | POA: Diagnosis not present

## 2023-11-22 NOTE — Telephone Encounter (Signed)
 Called patient to discuss breast concern and review message from PCP office regarding her request for mammo and US . Reviewed that UC is limited with regards to available imaging modalities and referrals and this is a concern best managed with PCP office. Pt denies significant swelling, drainage, pain or erythema in area of concern. She voiced that she would proceed to follow up with PCP to move up her Mammogram and get scheduled for ultrasound through them.

## 2023-11-22 NOTE — Progress Notes (Signed)
   Acute Office Visit  Subjective:     Patient ID: Shirley Murray, female    DOB: November 29, 1970, 53 y.o.   MRN: [id number]   Patient presents today with concerns for left breast mass. She states she was showering Sunday night and noticed a new lump in her breast. She denies pain or tenderness, skin changes, nipple discharge, or fever. She denies history of abnormal mammograms, no recent being September of 2024. She denies family history of breast cancer, however notes family history of colon cancer and uterine cancer. Denies weight loss or change in appetite.     Review of Systems  Constitutional:  Negative for fever, malaise/fatigue and weight loss.  Gastrointestinal:  Negative for nausea and vomiting.  Skin:  Negative for itching and rash.  Neurological:  Negative for dizziness and headaches.  Endo/Heme/Allergies:  Does not bruise/bleed easily.        Objective:     BP 102/67   Pulse 73   Temp 97.9 F (36.6 C)   Ht 5' 4.5 (1.638 m)   Wt 141 lb 3.2 oz (64 kg)   LMP 12/19/2016 (Exact Date)   SpO2 100%   BMI 23.86 kg/m   Physical Exam Constitutional:      General: She is not in acute distress.    Appearance: Normal appearance.  HENT:     Head: Normocephalic and atraumatic.   Cardiovascular:     Rate and Rhythm: Normal rate and regular rhythm.     Heart sounds: Normal heart sounds. No murmur heard. Pulmonary:     Effort: Pulmonary effort is normal.     Breath sounds: Normal breath sounds.  Chest:  Breasts:    Right: Normal.     Left: Mass present. No bleeding, nipple discharge, skin change or tenderness.    Musculoskeletal:     Cervical back: Normal range of motion and neck supple.  Lymphadenopathy:     Cervical: No cervical adenopathy.   Skin:    General: Skin is warm and dry.   Neurological:     General: No focal deficit present.     Mental Status: She is alert.     No results found for any visits on 11/22/23.      Assessment & Plan:  Mass of  left breast, unspecified quadrant Assessment & Plan: Patient presents today with concern for left breast mass. Small, approximately 1 cm non tender mass palpated in left breast superior to areola. No erythema, no tenderness, no nipple discharge, no skin changes. Normal mammogram in September 2024, however mammogram required repeat images due to dense breast tissue. Will proceed with diagnostic mammogram and US  of left breast.   Orders: -     MM 3D DIAGNOSTIC MAMMOGRAM UNILATERAL LEFT BREAST -     US  BREAST COMPLETE UNI LEFT INC AXILLA   No follow-ups on file.  Charmaine Hamsini Verrilli, PA-C

## 2023-11-22 NOTE — Telephone Encounter (Signed)
 Appointment scheduled this afternoon at 4pm with McLeod PA

## 2023-11-22 NOTE — Assessment & Plan Note (Signed)
 Patient presents today with concern for left breast mass. Small, approximately 1 cm non tender mass palpated in left breast superior to areola. No erythema, no tenderness, no nipple discharge, no skin changes. Normal mammogram in September 2024, however mammogram required repeat images due to dense breast tissue. Will proceed with diagnostic mammogram and US  of left breast.

## 2023-11-23 ENCOUNTER — Telehealth: Payer: Self-pay

## 2023-11-23 NOTE — Telephone Encounter (Signed)
 Communication  Reason for CRM: Patient states Left breast ultrasound needs to be corrected to say limited so that patient may call and schedule with Zelda Pagan        Callback #: [phone]

## 2023-12-08 ENCOUNTER — Ambulatory Visit (HOSPITAL_COMMUNITY)
Admission: RE | Admit: 2023-12-08 | Discharge: 2023-12-08 | Disposition: A | Discharge status: Home / Self Care | Source: Ambulatory Visit | Attending: Physician Assistant | Admitting: Physician Assistant

## 2023-12-08 ENCOUNTER — Encounter (HOSPITAL_COMMUNITY): Payer: Self-pay

## 2023-12-08 DIAGNOSIS — N6459 Other signs and symptoms in breast: Secondary | ICD-10-CM | POA: Diagnosis not present

## 2023-12-08 DIAGNOSIS — R92333 Mammographic heterogeneous density, bilateral breasts: Secondary | ICD-10-CM | POA: Diagnosis not present

## 2023-12-08 DIAGNOSIS — N6320 Unspecified lump in the left breast, unspecified quadrant: Secondary | ICD-10-CM | POA: Diagnosis not present

## 2023-12-09 ENCOUNTER — Ambulatory Visit: Payer: Self-pay | Admitting: Physician Assistant

## 2023-12-30 ENCOUNTER — Telehealth: Admitting: Nurse Practitioner

## 2023-12-30 DIAGNOSIS — L509 Urticaria, unspecified: Secondary | ICD-10-CM

## 2023-12-31 MED ORDER — DIPHENHYDRAMINE HCL 25 MG PO TABS
25.0000 mg | ORAL_TABLET | Freq: Four times a day (QID) | ORAL | 0 refills | Status: AC | PRN
Start: 2023-12-31 — End: ?

## 2023-12-31 NOTE — Progress Notes (Signed)
 I have spent 5 minutes in review of e-visit questionnaire, review and updating patient chart, medical decision making and response to patient.   Claiborne Rigg, NP

## 2023-12-31 NOTE — Progress Notes (Signed)
 E Visit for Rash  We are sorry that you are not feeling well. Here is how we plan to help!   I recommend you take Benadryl  25 mg - 50 mg every 4 hours to control the symptoms but if they last over 24 hours it is best that you see an office based provider for follow up.  Hives can stem from temperature changes, stress and or exercise in addition to an allergic reaction to possible foods or fillers in medications.     HOME CARE:  Take cool showers and avoid direct sunlight. Apply cool compress or wet dressings. Take a bath in an oatmeal bath.  Sprinkle content of one Aveeno packet under running faucet with comfortably warm water .  Bathe for 15-20 minutes, 1-2 times daily.  Pat dry with a towel. Do not rub the rash. Use hydrocortisone cream. Take an antihistamine like Benadryl  for widespread rashes that itch.  The adult dose of Benadryl  is 25-50 mg by mouth 4 times daily. Caution:  This type of medication may cause sleepiness.  Do not drink alcohol, drive, or operate dangerous machinery while taking antihistamines.  Do not take these medications if you have prostate enlargement.  Read package instructions thoroughly on all medications that you take.  GET HELP RIGHT AWAY IF:  Symptoms don't go away after treatment. Severe itching that persists. If you rash spreads or swells. If you rash begins to smell. If it blisters and opens or develops a yellow-brown crust. You develop a fever. You have a sore throat. You become short of breath.  MAKE SURE YOU:  Understand these instructions. Will watch your condition. Will get help right away if you are not doing well or get worse.  Thank you for choosing an e-visit.  Your e-visit answers were reviewed by a board certified advanced clinical practitioner to complete your personal care plan. Depending upon the condition, your plan could have included both over the counter or prescription medications.  Please review your pharmacy choice. Make  sure the pharmacy is open so you can pick up prescription now. If there is a problem, you may contact your provider through [employer] and have the prescription routed to another pharmacy.  Your safety is important to us . If you have drug allergies check your prescription carefully.   For the next 24 hours you can use MyChart to ask questions about today's visit, request a non-urgent call back, or ask for a work or school excuse. You will get an email in the next two days asking about your experience. I hope that your e-visit has been valuable and will speed your recovery.

## 2024-01-01 ENCOUNTER — Telehealth: Admitting: Physician Assistant

## 2024-01-01 DIAGNOSIS — R21 Rash and other nonspecific skin eruption: Secondary | ICD-10-CM

## 2024-01-01 MED ORDER — HYDROXYZINE PAMOATE 25 MG PO CAPS: 25.0000 mg | ORAL_CAPSULE | Freq: Three times a day (TID) | ORAL | 0 refills | Status: AC | PRN

## 2024-01-01 MED ORDER — PREDNISONE 20 MG PO TABS: 40.0000 mg | ORAL_TABLET | Freq: Every day | ORAL | 0 refills | Status: AC

## 2024-01-01 NOTE — Progress Notes (Signed)
 E Visit for Rash  We are sorry that you are not feeling well. Here is how we plan to help!  Based on what you shared with me you may have a virus or an allergic reaction.  Avoid contact with pregnant women until a diagnosis is made.  Most viral rashes are contagious (especially if a fever is present).  You can return to work or school after the rash is gone or when your doctor says it is safe to return with the rash.   I will send in a course of prednisone . I can also send in hydroxyzine  to take for itching, can take this at bedtime.  Do not take benadryl  if taking hydroxyzine .    HOME CARE:  Take cool showers and avoid direct sunlight. Apply cool compress or wet dressings. Take a bath in an oatmeal bath.  Sprinkle content of one Aveeno packet under running faucet with comfortably warm water .  Bathe for 15-20 minutes, 1-2 times daily.  Pat dry with a towel. Do not rub the rash. Use hydrocortisone cream. Take an antihistamine like Benadryl  for widespread rashes that itch.  The adult dose of Benadryl  is 25-50 mg by mouth 4 times daily. Caution:  This type of medication may cause sleepiness.  Do not drink alcohol, drive, or operate dangerous machinery while taking antihistamines.  Do not take these medications if you have prostate enlargement.  Read package instructions thoroughly on all medications that you take.  GET HELP RIGHT AWAY IF:  Symptoms don't go away after treatment. Severe itching that persists. If you rash spreads or swells. If you rash begins to smell. If it blisters and opens or develops a yellow-brown crust. You develop a fever. You have a sore throat. You become short of breath.  MAKE SURE YOU:  Understand these instructions. Will watch your condition. Will get help right away if you are not doing well or get worse.  Thank you for choosing an e-visit.  Your e-visit answers were reviewed by a board certified advanced clinical practitioner to complete your personal  care plan. Depending upon the condition, your plan could have included both over the counter or prescription medications.  Please review your pharmacy choice. Make sure the pharmacy is open so you can pick up prescription now. If there is a problem, you may contact your provider through [employer] and have the prescription routed to another pharmacy.  Your safety is important to us . If you have drug allergies check your prescription carefully.   For the next 24 hours you can use MyChart to ask questions about today's visit, request a non-urgent call back, or ask for a work or school excuse. You will get an email in the next two days asking about your experience. I hope that your e-visit has been valuable and will speed your recovery.   I have spent 5 minutes in review of e-visit questionnaire, review and updating patient chart, medical decision making and response to patient.   Harlene PEDLAR Ward, PA-C

## 2024-01-13 ENCOUNTER — Encounter (HOSPITAL_BASED_OUTPATIENT_CLINIC_OR_DEPARTMENT_OTHER): Payer: Self-pay

## 2024-01-13 ENCOUNTER — Other Ambulatory Visit: Payer: Self-pay

## 2024-01-13 ENCOUNTER — Emergency Department (HOSPITAL_BASED_OUTPATIENT_CLINIC_OR_DEPARTMENT_OTHER)
Admission: EM | Admit: 2024-01-13 | Discharge: 2024-01-13 | Disposition: A | Discharge status: Home / Self Care | Attending: Emergency Medicine | Admitting: Emergency Medicine

## 2024-01-13 DIAGNOSIS — Z85038 Personal history of other malignant neoplasm of large intestine: Secondary | ICD-10-CM | POA: Diagnosis not present

## 2024-01-13 DIAGNOSIS — R21 Rash and other nonspecific skin eruption: Secondary | ICD-10-CM | POA: Diagnosis present

## 2024-01-13 DIAGNOSIS — T782XXA Anaphylactic shock, unspecified, initial encounter: Secondary | ICD-10-CM | POA: Insufficient documentation

## 2024-01-13 MED ORDER — DIPHENHYDRAMINE HCL 50 MG/ML IJ SOLN
25.0000 mg | Freq: Once | INTRAMUSCULAR | Status: AC
  Administered 2024-01-13: 25 mg via INTRAVENOUS
  Filled 2024-01-13: qty 1

## 2024-01-13 MED ORDER — FAMOTIDINE IN NACL 20-0.9 MG/50ML-% IV SOLN
20.0000 mg | Freq: Once | INTRAVENOUS | Status: AC
  Administered 2024-01-13: 20 mg via INTRAVENOUS
  Filled 2024-01-13: qty 50

## 2024-01-13 MED ORDER — DEXAMETHASONE SODIUM PHOSPHATE 10 MG/ML IJ SOLN
10.0000 mg | Freq: Once | INTRAMUSCULAR | Status: AC
  Administered 2024-01-13: 10 mg via INTRAVENOUS
  Filled 2024-01-13: qty 1

## 2024-01-13 MED ORDER — EPINEPHRINE 0.3 MG/0.3ML IJ SOAJ
0.3000 mg | Freq: Once | INTRAMUSCULAR | Status: AC
  Administered 2024-01-13: 0.3 mg via INTRAMUSCULAR
  Filled 2024-01-13: qty 0.3

## 2024-01-13 MED ORDER — EPINEPHRINE 0.3 MG/0.3ML IJ SOAJ: 0.3000 mg | INTRAMUSCULAR | 0 refills | Status: AC | PRN

## 2024-01-13 MED ORDER — PREDNISONE 20 MG PO TABS: ORAL_TABLET | ORAL | 0 refills | Status: AC

## 2024-01-13 NOTE — Discharge Instructions (Addendum)
 Continue using the hydroxyzine  or Benadryl  as needed for itching.  Continue the Allegra.  I would recommend adding Pepcid  as well.  Make an appointment to have close follow-up with your allergist.  Return to the emergency room if you have any worsening symptoms.

## 2024-01-13 NOTE — ED Notes (Signed)
 Welts decreasing in redness and amount. Pt reporting that throat feels more clear and face does not feel tight anymore. Will continue to monitor.

## 2024-01-13 NOTE — ED Triage Notes (Addendum)
 Starting about 2 weeks ago, pt broke out in hives on arms and abdomen. Took prednisone  and finished it on Thursday - states the reaction has come back even worse. Lips swollen and face feels tight. Unknown what the reaction was to. Pt with throat clearing during triage, states that her throat feels scratchy.

## 2024-01-13 NOTE — ED Provider Notes (Signed)
 [place] [facility] [place] Provider Note   CSN: [id number] Arrival date & time: 01/13/24  9178     Patient presents with: Allergic Reaction   Shirley Murray is a 53 y.o. female.   Patient is a 53 year old female who presents with rash and itching.  She said she started having hives about 2 weeks ago.  She was seen on August 1 at an ED visit.  She was advised to use Benadryl .  She was seen a couple days later due to ongoing rash and was prescribed a short course of prednisone .  She had some worsening symptoms and was seen in urgent care on August 9.  She was given a Depo-Medrol 80 mg IM injection and prescribed a tapering steroid pack.  She completed this yesterday.  She says the steroids do not tend to help.  She has rash pretty much either every evening.  It is very itchy.  Today she woke up and had been itching all through the night with the rash.  She said that she felt like her face was swollen and her lips are swelling.  Her throat is scratchy and she feels like she has to keep clearing it.  She denies any shortness of breath.  She has had history of prior medication allergies and she has allergies to environmental factors.  She previously has seen an allergist but it has been several years.  She has not had any recent change in soaps or detergent.  No new medications.  No new known exposures.       Prior to Admission medications   Medication Sig Start Date End Date Taking? Authorizing Provider  EPINEPHrine  0.3 mg/0.3 mL IJ SOAJ injection Inject 0.3 mg into the muscle as needed for anaphylaxis. 01/13/24  Yes Shirley Hollering, MD  predniSONE  (DELTASONE ) 20 MG tablet 2 tabs po daily x 4 days 01/14/24  Yes Shirley Hollering, MD  cetirizine (ZYRTEC) 10 MG tablet Take 10 mg by mouth daily.    [provider]  diphenhydrAMINE  (BENADRYL  ALLERGY) 25 MG tablet Take 1-2 tablets (25-50 mg total) by mouth every 6 (six) hours as needed. 12/31/23   Murray, Shirley W, NP   fluticasone (FLONASE) 50 MCG/ACT nasal spray Place 2 sprays into both nostrils daily.    [provider]  hydrOXYzine  (VISTARIL ) 25 MG capsule Take 1 capsule (25 mg total) by mouth every 8 (eight) hours as needed. 01/01/24   Ward, Shirley PEDLAR, PA-C    Allergies: Codeine, Maxalt  [rizatriptan ], Zoloft  [sertraline  hcl], Augmentin  [amoxicillin -pot clavulanate], Dilaudid  [hydromorphone ], Imitrex  [sumatriptan ], Morphine  and codeine, Mucinex [guaifenesin er], and Relpax  [eletriptan ]    Review of Systems  Constitutional:  Negative for chills, diaphoresis, fatigue and fever.  HENT:  Positive for facial swelling and sore throat. Negative for congestion, rhinorrhea, sneezing, trouble swallowing and voice change.   Eyes: Negative.   Respiratory:  Positive for cough. Negative for chest tightness and shortness of breath.   Cardiovascular:  Negative for chest pain.  Gastrointestinal:  Negative for abdominal pain, nausea and vomiting.  Musculoskeletal:  Negative for joint swelling.  Skin:  Positive for rash. Negative for wound.  Neurological:  Negative for dizziness, weakness, numbness and headaches.    Updated Vital Signs BP 101/70   Pulse 77   Temp 97.7 F (36.5 C)   Resp 15   Ht 5' 5 (1.651 m)   Wt 63.5 kg   LMP 12/19/2016 (Exact Date)   SpO2 95%   BMI 23.30 kg/m  Physical Exam Constitutional:      Appearance: She is well-developed.  HENT:     Head: Normocephalic and atraumatic.     Mouth/Throat:     Comments: Swelling of the upper and lower lips, no visible swelling to the posterior pharynx or tongue Eyes:     Pupils: Pupils are equal, round, and reactive to light.  Cardiovascular:     Rate and Rhythm: Normal rate and regular rhythm.     Heart sounds: Normal heart sounds.  Pulmonary:     Effort: Pulmonary effort is normal. No respiratory distress.     Breath sounds: Normal breath sounds. No wheezing or rales.  Chest:     Chest wall: No tenderness.  Abdominal:      General: Bowel sounds are normal.     Palpations: Abdomen is soft.     Tenderness: There is no abdominal tenderness. There is no guarding or rebound.  Musculoskeletal:        General: Normal range of motion.     Cervical back: Normal range of motion and neck supple.  Lymphadenopathy:     Cervical: No cervical adenopathy.  Skin:    General: Skin is warm and dry.     Findings: Rash (Diffuse urticarial type rash, it is blanching, no petechiae or purpura) present.  Neurological:     Mental Status: She is alert and oriented to person, place, and time.     (all labs ordered are listed, but only abnormal results are displayed) Labs Reviewed - No data to display  EKG: None  Radiology: No results found.   Procedures   Medications Ordered in the ED  dexamethasone  (DECADRON ) injection 10 mg (10 mg Intravenous Given 01/13/24 0853)  EPINEPHrine  (EPI-PEN) injection 0.3 mg (0.3 mg Intramuscular Given 01/13/24 0848)  diphenhydrAMINE  (BENADRYL ) injection 25 mg (25 mg Intravenous Given 01/13/24 0853)  famotidine  (PEPCID ) IVPB 20 mg premix (0 mg Intravenous Stopped 01/13/24 0927)  diphenhydrAMINE  (BENADRYL ) injection 25 mg (25 mg Intravenous Given 01/13/24 1043)                                    Medical Decision Making Risk Prescription drug management.   This patient presents to the ED for concern of hives, this involves an extensive number of treatment options, and is a complaint that carries with it a high risk of complications and morbidity.  I considered the following differential and admission for this acute, potentially life threatening condition.  The differential diagnosis includes allergic reaction, anaphylaxis, infection, tick bite  MDM:    Patient is a 53 year old who presents with hives and itching.  She also has some facial swelling and an itchy scratchy throat with a dry cough.  Given her airway symptoms, she was treated for anaphylaxis with IV Decadron , epinephrine , Benadryl   and Pepcid .  She had ongoing improvement in symptoms.  At 1 point, her rash started coming back and was given additional dose of Benadryl .  She was monitored for about 4 and half hours and had ongoing improvement in symptoms.  She no longer has any airway involvement.  She was discharged home in good condition.  Discussed starting a few days worth of prednisone  as well as continuing her Allegra and Benadryl  as needed.  Will also add Pepcid  to her regimen.  She has an allergist and advised her to call and have close follow-up with her allergist.  Return precautions were given.  CRITICAL CARE Performed by: Andrea Ness Total critical care time: 60 minutes Critical care time was exclusive of separately billable procedures and treating other patients. Critical care was necessary to treat or prevent imminent or life-threatening deterioration. Critical care was time spent personally by me on the following activities: development of treatment plan with patient and/or surrogate as well as nursing, discussions with consultants, evaluation of patient's response to treatment, examination of patient, obtaining history from patient or surrogate, ordering and performing treatments and interventions, ordering and review of laboratory studies, ordering and review of radiographic studies, pulse oximetry and re-evaluation of patient's condition.  (Labs, imaging, consults)  Labs: I Ordered, and personally interpreted labs.  The pertinent results include: None  Imaging Studies ordered: I ordered imaging studies including none I independently visualized and interpreted imaging. I agree with the radiologist interpretation  Additional history obtained from chart.  External records from outside source obtained and reviewed including prior notes  Cardiac Monitoring: The patient was maintained on a cardiac monitor.  If on the cardiac monitor, I personally viewed and interpreted the cardiac monitored which showed an  underlying rhythm of: Sinus rhythm  Reevaluation: After the interventions noted above, I reevaluated the patient and found that they have :improved  Social Determinants of Health:    Disposition: Discharged to home  Co morbidities that complicate the patient evaluation  Past Medical History:  Diagnosis Date   Allergy    codeine   Anxiety    Breast disorder    fibrocystic breast    Colon cancer (HCC)    Headache(784.0)    Migraines   History of kidney stones    Mental disorder    ocd   OCD (obsessive compulsive disorder)      Medicines Meds ordered this encounter  Medications   dexamethasone  (DECADRON ) injection 10 mg   EPINEPHrine  (EPI-PEN) injection 0.3 mg   diphenhydrAMINE  (BENADRYL ) injection 25 mg   famotidine  (PEPCID ) IVPB 20 mg premix   diphenhydrAMINE  (BENADRYL ) injection 25 mg   EPINEPHrine  0.3 mg/0.3 mL IJ SOAJ injection    Sig: Inject 0.3 mg into the muscle as needed for anaphylaxis.    Dispense:  1 each    Refill:  0   predniSONE  (DELTASONE ) 20 MG tablet    Sig: 2 tabs po daily x 4 days    Dispense:  8 tablet    Refill:  0    I have reviewed the patients home medicines and have made adjustments as needed  Problem List / ED Course: Problem List Items Addressed This Visit   None Visit Diagnoses       Anaphylaxis, initial encounter    -  Primary                Final diagnoses:  Anaphylaxis, initial encounter    ED Discharge Orders          Ordered    predniSONE  (DELTASONE ) 20 MG tablet        01/13/24 1254    EPINEPHrine  0.3 mg/0.3 mL IJ SOAJ injection  As needed        01/13/24 1254               Ness Andrea, MD 01/13/24 1258

## 2024-01-18 ENCOUNTER — Emergency Department (HOSPITAL_BASED_OUTPATIENT_CLINIC_OR_DEPARTMENT_OTHER)
Admission: EM | Admit: 2024-01-18 | Discharge: 2024-01-18 | Disposition: A | Discharge status: Home / Self Care | Attending: Emergency Medicine | Admitting: Emergency Medicine

## 2024-01-18 ENCOUNTER — Other Ambulatory Visit: Payer: Self-pay

## 2024-01-18 ENCOUNTER — Encounter (HOSPITAL_BASED_OUTPATIENT_CLINIC_OR_DEPARTMENT_OTHER): Payer: Self-pay

## 2024-01-18 DIAGNOSIS — T782XXA Anaphylactic shock, unspecified, initial encounter: Secondary | ICD-10-CM | POA: Insufficient documentation

## 2024-01-18 DIAGNOSIS — R220 Localized swelling, mass and lump, head: Secondary | ICD-10-CM | POA: Diagnosis not present

## 2024-01-18 MED ORDER — EPINEPHRINE 0.3 MG/0.3ML IJ SOAJ
0.3000 mg | Freq: Once | INTRAMUSCULAR | Status: AC
  Administered 2024-01-18: 0.3 mg via INTRAMUSCULAR
  Filled 2024-01-18: qty 0.3

## 2024-01-18 MED ORDER — DEXAMETHASONE 4 MG PO TABS
10.0000 mg | ORAL_TABLET | Freq: Once | ORAL | Status: AC
  Administered 2024-01-18: 10 mg via ORAL
  Filled 2024-01-18: qty 3

## 2024-01-18 MED ORDER — FAMOTIDINE 20 MG PO TABS
40.0000 mg | ORAL_TABLET | Freq: Once | ORAL | Status: AC
  Administered 2024-01-18: 40 mg via ORAL
  Filled 2024-01-18: qty 2

## 2024-01-18 MED ORDER — DIPHENHYDRAMINE HCL 25 MG PO CAPS
50.0000 mg | ORAL_CAPSULE | Freq: Once | ORAL | Status: AC
  Administered 2024-01-18: 50 mg via ORAL
  Filled 2024-01-18: qty 2

## 2024-01-18 MED ORDER — DEXAMETHASONE 2 MG PO TABS: 10.0000 mg | ORAL_TABLET | Freq: Once | ORAL | 0 refills | Status: AC

## 2024-01-18 NOTE — ED Provider Notes (Signed)
 [place] EMERGENCY DEPARTMENT AT [facility] [place] Provider Note   CSN: [id number] Arrival date & time: 01/18/24  9172     Patient presents with: Allergic Reaction   Shirley Murray is a 53 y.o. female.   53 yo F with a chief complaint of hives and facial swelling.  She also feels like she needs to clear her throat frequently.  She reports has been struggling with this for weeks.  Has been to the emergency department multiple times.  Is trying to get into her allergist office.  No difficulty breathing.  No vomiting no diarrhea.  Has felt a bit dizzy at times.   Allergic Reaction      Prior to Admission medications   Medication Sig Start Date End Date Taking? Authorizing Provider  dexamethasone  (DECADRON ) 2 MG tablet Take 5 tablets (10 mg total) by mouth once for 1 dose. 01/21/24 01/21/24 Yes Emil Share, DO  cetirizine (ZYRTEC) 10 MG tablet Take 10 mg by mouth daily.    [provider]  diphenhydrAMINE  (BENADRYL  ALLERGY) 25 MG tablet Take 1-2 tablets (25-50 mg total) by mouth every 6 (six) hours as needed. 12/31/23   Fleming, Zelda W, NP  EPINEPHrine  0.3 mg/0.3 mL IJ SOAJ injection Inject 0.3 mg into the muscle as needed for anaphylaxis. 01/13/24   Lenor Hollering, MD  fluticasone (FLONASE) 50 MCG/ACT nasal spray Place 2 sprays into both nostrils daily.    [provider]  hydrOXYzine  (VISTARIL ) 25 MG capsule Take 1 capsule (25 mg total) by mouth every 8 (eight) hours as needed. 01/01/24   Ward, Jessica Z, PA-C  predniSONE  (DELTASONE ) 20 MG tablet 2 tabs po daily x 4 days 01/14/24   Lenor Hollering, MD    Allergies: Bee venom, Codeine, Maxalt  [rizatriptan ], Zoloft  [sertraline  hcl], Augmentin  [amoxicillin -pot clavulanate], Dilaudid  [hydromorphone ], Imitrex  [sumatriptan ], Morphine  and codeine, Mucinex [guaifenesin er], and Relpax  [eletriptan ]    Review of Systems  Updated Vital Signs BP 113/71   Pulse 83   Temp 98.3 F (36.8 C) (Oral)   Resp 18   Ht 5' 5  (1.651 m)   Wt 63.5 kg   LMP 12/19/2016 (Exact Date)   SpO2 97%   BMI 23.30 kg/m   Physical Exam Vitals and nursing note reviewed.  Constitutional:      General: She is not in acute distress.    Appearance: She is well-developed. She is not diaphoretic.  HENT:     Head: Normocephalic and atraumatic.  Eyes:     Pupils: Pupils are equal, round, and reactive to light.  Cardiovascular:     Rate and Rhythm: Normal rate and regular rhythm.     Heart sounds: No murmur heard.    No friction rub. No gallop.  Pulmonary:     Effort: Pulmonary effort is normal.     Breath sounds: No wheezing or rales.  Abdominal:     General: There is no distension.     Palpations: Abdomen is soft.     Tenderness: There is no abdominal tenderness.  Musculoskeletal:        General: No tenderness.     Cervical back: Normal range of motion and neck supple.  Skin:    General: Skin is warm and dry.     Comments: Diffuse hives.  Left-sided facial swelling.  No obvious posterior oropharyngeal GI erythema or edema.  Neurological:     Mental Status: She is alert and oriented to person, place, and time.  Psychiatric:  Behavior: Behavior normal.     (all labs ordered are listed, but only abnormal results are displayed) Labs Reviewed - No data to display  EKG: None  Radiology: No results found.   .Critical Care  Performed by: Emil Share, DO Authorized by: Emil Share, DO   Critical care provider statement:    Critical care time (minutes):  35   Critical care time was exclusive of:  Separately billable procedures and treating other patients   Critical care was time spent personally by me on the following activities:  Development of treatment plan with patient or surrogate, discussions with consultants, evaluation of patient's response to treatment, examination of patient, ordering and review of laboratory studies, ordering and review of radiographic studies, ordering and performing treatments and  interventions, pulse oximetry, re-evaluation of patient's condition and review of old charts   Care discussed with: admitting provider      Medications Ordered in the ED  dexamethasone  (DECADRON ) tablet 10 mg (10 mg Oral Given 01/18/24 0850)  EPINEPHrine  (EPI-PEN) injection 0.3 mg (0.3 mg Intramuscular Given 01/18/24 0852)  diphenhydrAMINE  (BENADRYL ) capsule 50 mg (50 mg Oral Given 01/18/24 0953)  famotidine  (PEPCID ) tablet 40 mg (40 mg Oral Given 01/18/24 0953)                                    Medical Decision Making Risk Prescription drug management.   53 yo F with a chief complaints of an allergic reaction.  This unfortunately is been going on for some time.  She was seen in the ED 3 days ago and was given an epinephrine  injection and had transient improvement but worsening since Saturday.  She is unfortunately already on steroids and max dose antihistamines.  Continues to have recurrent hives.  I had a long discussion with the patient.  Not sure what could be added.  You could qualify this as anaphylaxis with perhaps GI symptoms with frequent throat clearing as well as skin involvement.  Will give an epinephrine  injection.  Will attempt to switch from prednisone  to Decadron .  Encouraged her to follow-up with her allergist.  Patient is feeling mildly better and would like to go home.  She was able to contact her allergist while here and has an appointment scheduled for Tuesday.  11:26 AM:  I have discussed the diagnosis/risks/treatment options with the patient.  Evaluation and diagnostic testing in the emergency department does not suggest an emergent condition requiring admission or immediate intervention beyond what has been performed at this time.  They will follow up with PCP, allergy. We also discussed returning to the ED immediately if new or worsening sx occur. We discussed the sx which are most concerning (e.g., sudden worsening pain, fever, inability to tolerate by mouth,  anaphylaxis s/sx) that necessitate immediate return. Medications administered to the patient during their visit and any new prescriptions provided to the patient are listed below.  Medications given during this visit Medications  dexamethasone  (DECADRON ) tablet 10 mg (10 mg Oral Given 01/18/24 0850)  EPINEPHrine  (EPI-PEN) injection 0.3 mg (0.3 mg Intramuscular Given 01/18/24 0852)  diphenhydrAMINE  (BENADRYL ) capsule 50 mg (50 mg Oral Given 01/18/24 0953)  famotidine  (PEPCID ) tablet 40 mg (40 mg Oral Given 01/18/24 9046)     The patient appears reasonably screen and/or stabilized for discharge and I doubt any other medical condition or other [facility] requiring further screening, evaluation, or treatment in the ED at this time  prior to discharge.       Final diagnoses:  Anaphylaxis, initial encounter    ED Discharge Orders          Ordered    dexamethasone  (DECADRON ) 2 MG tablet   Once        01/18/24 1047               Emil Share, [place] 01/18/24 1126

## 2024-01-18 NOTE — Discharge Instructions (Signed)
 Please follow-up with urologist as instructed.  Please return if you are concerned for anaphylaxis, especially if you develop difficulty breathing or swallowing or if you feel like you are going to pass out.

## 2024-01-18 NOTE — ED Triage Notes (Addendum)
 Pt has had intermittent rash, scratchy throat, lip/ facial swelling x3 weeks. Itchy rash on bilat upper/ lower extremities. Woke up this AM w worsening scratchy throat/ lip swelling. Endorses left facial tingling/ tightness. Took Allegra and Pepcid  this AM with no relief. Took Benadryl  last night w no relief.   Seen in ED last week for same issue

## 2024-01-24 DIAGNOSIS — H02846 Edema of left eye, unspecified eyelid: Secondary | ICD-10-CM | POA: Diagnosis not present

## 2024-01-24 DIAGNOSIS — J309 Allergic rhinitis, unspecified: Secondary | ICD-10-CM | POA: Diagnosis not present

## 2024-01-24 DIAGNOSIS — R21 Rash and other nonspecific skin eruption: Secondary | ICD-10-CM | POA: Diagnosis not present

## 2024-04-11 DIAGNOSIS — J309 Allergic rhinitis, unspecified: Secondary | ICD-10-CM | POA: Diagnosis not present

## 2024-04-11 DIAGNOSIS — R21 Rash and other nonspecific skin eruption: Secondary | ICD-10-CM | POA: Diagnosis not present

## 2024-04-11 DIAGNOSIS — H02846 Edema of left eye, unspecified eyelid: Secondary | ICD-10-CM | POA: Diagnosis not present
